# Patient Record
Sex: Female | Born: 2000 | Race: White | Hispanic: Yes | Marital: Married | Smoking: Never smoker
Health system: Southern US, Community
[De-identification: ages and names within clinical notes are randomized; demographics above are authoritative.]

## PROBLEM LIST (undated history)

## (undated) DIAGNOSIS — E162 Hypoglycemia, unspecified: Secondary | ICD-10-CM

## (undated) DIAGNOSIS — O99019 Anemia complicating pregnancy, unspecified trimester: Secondary | ICD-10-CM

## (undated) HISTORY — DX: Hypoglycemia, unspecified: E16.2

## (undated) HISTORY — PX: NO PAST SURGERIES: SHX2092

## (undated) HISTORY — DX: Anemia complicating pregnancy, unspecified trimester: O99.019

---
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
## 2021-01-03 ENCOUNTER — Other Ambulatory Visit: Payer: BC Managed Care – PPO

## 2021-01-08 DIAGNOSIS — Z3403 Encounter for supervision of normal first pregnancy, third trimester: Secondary | ICD-10-CM | POA: Diagnosis not present

## 2021-01-08 DIAGNOSIS — O2340 Unspecified infection of urinary tract in pregnancy, unspecified trimester: Secondary | ICD-10-CM | POA: Diagnosis not present

## 2021-01-08 DIAGNOSIS — Z3689 Encounter for other specified antenatal screening: Secondary | ICD-10-CM | POA: Diagnosis not present

## 2021-01-08 DIAGNOSIS — O2610 Low weight gain in pregnancy, unspecified trimester: Secondary | ICD-10-CM | POA: Diagnosis not present

## 2021-01-10 ENCOUNTER — Encounter: Payer: Self-pay | Admitting: Family Medicine

## 2021-01-10 ENCOUNTER — Other Ambulatory Visit: Payer: Self-pay

## 2021-01-10 ENCOUNTER — Ambulatory Visit (INDEPENDENT_AMBULATORY_CARE_PROVIDER_SITE_OTHER): Payer: BC Managed Care – PPO | Admitting: Family Medicine

## 2021-01-10 VITALS — BP 106/70 | HR 85 | Temp 97.6°F | Ht 61.5 in | Wt 99.2 lb

## 2021-01-10 DIAGNOSIS — Z0000 Encounter for general adult medical examination without abnormal findings: Secondary | ICD-10-CM | POA: Diagnosis not present

## 2021-01-10 DIAGNOSIS — F418 Other specified anxiety disorders: Secondary | ICD-10-CM

## 2021-01-10 NOTE — Assessment & Plan Note (Signed)
Healthy 20 yo  Currently pregnant Preventative protocols reviewed and updated unless pt declined. Discussed healthy diet and lifestyle.

## 2021-01-10 NOTE — Progress Notes (Signed)
Patient ID: [name], female    DOB: Dec 20, 2000, 20 y.o.   MRN: [id number]  This visit was conducted in person.  BP 106/70   Pulse 85   Temp 97.6 F (36.4 C) (Temporal)   Ht 5' 1.5" (1.562 m)   Wt 99 lb 4 oz (45 kg)   LMP 07/02/2020   SpO2 100%   BMI 18.45 kg/m    CC: 20 yo well adolescent visit Subjective:   HPI: Tanya Medina is a 20 y.o. female presenting on 01/10/2021 for Well Child (Here for 19 yr WCC. )   Presents alone today.  Upcoming birthday  Home -  Lives with mom and mom's BF.  Has chores.   School -  [employer] graduated 2021.  [employer] this past year - studying business administration, Bs/Cs. Not working  Activity/Exercise -  Walking regularly   Diet -  Good water, fruits/vegetables, prenatal vitamin   Limits sodas, juice.   Immunizations -  Reviewed [facility] COVID vaccine x2, booster 07/2020 Insurance underwriter)  Currently 7 months pregnant. Sees Dr Wonda Olds. EDC 04/02/2021 LMP - 07/02/2020   Seat belt use discussed Sunscreen use discussed. No changing moles on skin.  Dentist - q6 mo  Eye exam - has not recently seen. Uses glasses regularly  With parent out of room: Alcohol - none Smoking/vaping/smokeless tobacco - none Recreational drugs - none Mood - struggled with depressed mood until she saw online psychologist - sees once a month ([facility] online program) Sex - with monogamous boyfriend      Relevant past medical, surgical, family and social history reviewed and updated as indicated. Interim medical history since our last visit reviewed. Allergies and medications reviewed and updated. Outpatient Medications Prior to Visit  Medication Sig Dispense Refill   Prenatal Vit-Fe Fumarate-FA (PRENATAL COMPLETE) 14-0.4 MG TABS Take 1 tablet daily. 60 tablet 0   No facility-administered medications prior to visit.     Per HPI unless specifically indicated in ROS section below Review of Systems  Constitutional:  Negative for activity  change, appetite change, chills, fatigue, fever and unexpected weight change.  HENT:  Negative for hearing loss.   Eyes:  Negative for visual disturbance.  Respiratory:  Negative for cough, chest tightness, shortness of breath and wheezing.   Cardiovascular:  Positive for leg swelling (pregnancy related). Negative for chest pain and palpitations.  Gastrointestinal:  Positive for nausea. Negative for abdominal distention, abdominal pain, blood in stool, constipation, diarrhea and vomiting.  Genitourinary:  Negative for difficulty urinating and hematuria.  Musculoskeletal:  Negative for arthralgias, myalgias and neck pain.  Skin:  Negative for rash.  Neurological:  Positive for dizziness. Negative for seizures, syncope and headaches.  Hematological:  Negative for adenopathy. Does not bruise/bleed easily.  Psychiatric/Behavioral:  Positive for dysphoric mood. The patient is nervous/anxious.        Doing much better since starting monthly counseling.   Objective:  BP 106/70   Pulse 85   Temp 97.6 F (36.4 C) (Temporal)   Ht 5' 1.5" (1.562 m)   Wt 99 lb 4 oz (45 kg)   LMP 07/02/2020   SpO2 100%   BMI 18.45 kg/m   Wt Readings from Last 3 Encounters:  01/10/21 99 lb 4 oz (45 kg) (3 %, Z= -1.83)*  01/03/20 87 lb 7 oz (39.7 kg) (<1 %, Z= -3.05)*   * Growth percentiles are based on CDC (Girls, 2-20 Years) data.      Physical Exam Vitals and nursing  note reviewed.  Constitutional:      Appearance: Normal appearance. She is not ill-appearing.  HENT:     Head: Normocephalic and atraumatic.     Right Ear: Tympanic membrane, ear canal and external ear normal. There is no impacted cerumen.     Left Ear: Tympanic membrane, ear canal and external ear normal. There is no impacted cerumen.  Eyes:     General:        Right eye: No discharge.        Left eye: No discharge.     Extraocular Movements: Extraocular movements intact.     Conjunctiva/sclera: Conjunctivae normal.     Pupils: Pupils  are equal, round, and reactive to light.  Neck:     Thyroid: No thyroid mass or thyromegaly.  Cardiovascular:     Rate and Rhythm: Normal rate and regular rhythm.     Pulses: Normal pulses.     Heart sounds: Normal heart sounds. No murmur heard. Pulmonary:     Effort: Pulmonary effort is normal. No respiratory distress.     Breath sounds: Normal breath sounds. No wheezing, rhonchi or rales.  Abdominal:     General: Bowel sounds are normal. There is no distension.     Palpations: Abdomen is soft. There is no mass.     Tenderness: no abdominal tenderness There is no guarding or rebound.     Hernia: No hernia is present.     Comments: Gravid   Musculoskeletal:     Cervical back: Normal range of motion and neck supple. No rigidity.     Right lower leg: No edema.     Left lower leg: No edema.  Lymphadenopathy:     Cervical: No cervical adenopathy.  Skin:    General: Skin is warm and dry.     Findings: No rash.  Neurological:     General: No focal deficit present.     Mental Status: She is alert. Mental status is at baseline.  Psychiatric:        Mood and Affect: Mood normal.        Behavior: Behavior normal.      Depression screen [facility] 2/9 01/10/2021 01/03/2020  Decreased Interest 0 0  Down, Depressed, Hopeless 0 1  PHQ - 2 Score 0 1  Altered sleeping 0 0  Tired, decreased energy 0 0  Change in appetite 0 1  Feeling bad or failure about yourself  0 0  Trouble concentrating 0 0  Moving slowly or fidgety/restless 0 0  Suicidal thoughts 0 -  PHQ-9 Score 0 2    GAD 7 : Generalized Anxiety Score 01/10/2021  Nervous, Anxious, on Edge 1  Control/stop worrying 1  Worry too much - different things 0  Trouble relaxing 0  Restless 0  Easily annoyed or irritable 1  Afraid - awful might happen 0  Total GAD 7 Score 3   Assessment & Plan:  This visit occurred during the SARS-CoV-2 public health emergency.  Safety protocols were in place, including screening questions prior to the  visit, additional usage of staff PPE, and extensive cleaning of exam room while observing appropriate contact time as indicated for disinfecting solutions.   Problem List Items Addressed This Visit     Health maintenance examination - Primary    Healthy 20 yo  Currently pregnant Preventative protocols reviewed and updated unless pt declined. Discussed healthy diet and lifestyle.        Depression with anxiety    During pregnancy  Doing much better with monthly counseling.  Low PHQ9/GAD7.  No need for medication.  Will need to closely monitor for post-partum depression.         No orders of the defined types were placed in this encounter.  No orders of the defined types were placed in this encounter.   Patient instructions: Depression/anxiety questionairre provided today.  Gusto verla hoy  Regresar en 1 ao para proximo examen fisico.   Follow up plan: Return in about 1 year (around 01/10/2022) for annual exam, prior fasting for blood work.  Eustaquio Boyden, MD

## 2021-01-10 NOTE — Patient Instructions (Addendum)
Depression/anxiety questionairre provided today.  Gusto verla hoy  Regresar en 1 ao para proximo examen fisico.  Mantenimiento de Radiographer, therapeutic en las [facility], Female Adoptar un estilo de vida saludable y recibir atencin preventiva son importantes para promover la salud y Counsellor. Consulte al mdico sobre: El esquema adecuado para hacerse pruebas y exmenes peridicos. Cosas que puede hacer por su cuenta para prevenir enfermedades y [employer]. Qu debo saber sobre la dieta, el peso y el ejercicio? Consuma una dieta saludable  Consuma una dieta que incluya muchas verduras, frutas, productos lcteos con bajo contenido de [place] y Associate Professor. No consuma muchos alimentos ricos en grasas slidas, azcares agregados o sodio.  Mantenga un peso saludable El ndice de masa muscular [facility]) se [place] para identificar problemas de [place]. Proporciona una estimacin de la grasa corporal basndose en el peso y la altura. Su mdico puede ayudarle a Engineer, site IMC y a Personnel officer o Pharmacologist unpeso saludable. Haga ejercicio con regularidad Haga ejercicio con regularidad. Esta es una de las prcticas ms importantes que puede hacer por su salud. La [employer] de los adultos deben seguir estas pautas: Education officer, environmental, al menos, 150 minutos de actividad fsica por semana. El ejercicio debe aumentar la frecuencia cardaca y Media planner transpirar (ejercicio de intensidad moderada). Hacer ejercicios de fortalecimiento por lo [employer] por semana. Agregue esto a su plan de ejercicio de intensidad moderada. Pasar menos tiempo sentados. Incluso la actividad fsica ligera puede ser beneficiosa. Controle sus niveles de colesterol y lpidos en la sangre Comience a realizarse anlisis de lpidos y colesterol en la sangre a los20 aos y luego reptalos cada 5 aos. Hgase controlar los niveles de colesterol con mayor frecuencia si: Sus niveles de lpidos y colesterol son altos. Es mayor de 40  aos. Presenta un alto riesgo de padecer enfermedades cardacas. Qu debo saber sobre las pruebas de deteccin del cncer? Segn su historia clnica y sus antecedentes familiares, es posible que deba realizarse pruebas de deteccin del cncer en diferentes edades. Esto puede incluir pruebas de deteccin de lo siguiente: Cncer de mama. Cncer de cuello uterino. Cncer colorrectal. Cncer de piel. Cncer de pulmn. Qu debo saber sobre la enfermedad cardaca, la diabetes y la hipertensinarterial? Presin arterial y enfermedad cardaca La hipertensin arterial causa enfermedades cardacas y [place] el riesgo de accidente cerebrovascular. Es ms probable que esto se manifieste en las personas que tienen lecturas de presin arterial alta, tienen ascendencia africana o tienen sobrepeso. Hgase controlar la presin arterial: Cada 3 a 5 aos si tiene entre 18 y 28 aos. Todos los aos si es mayor de 40 aos. Diabetes Realcese exmenes de deteccin de la diabetes con regularidad. Este anlisis revisa el nivel de azcar en la sangre en [place]. Hgase las pruebas de deteccin: Cada tres aos despus de los 40 aos de edad si tiene un peso normal y un bajo riesgo de padecer diabetes. Con ms frecuencia y a partir de [place] edad inferior si tiene sobrepeso o un alto riesgo de padecer diabetes. Qu debo saber sobre la prevencin de infecciones? Hepatitis B Si tiene un riesgo ms alto de contraer hepatitis B, debe someterse a un examen de deteccin de este virus. Hable con el mdico para averiguar si tiene riesgode contraer la infeccin por hepatitis B. Hepatitis C Se recomienda el anlisis a: [employer] 1944 y 1965. Todas las personas que tengan un riesgo de haber contrado hepatitis C. Enfermedades de transmisin sexual (ETS) Hgase las pruebas de  deteccin de ITS, incluidas la gonorrea y la clamidia, si: Es sexualmente activa y es menor de [address]. Es mayor de [address], y Scientist, research (physical sciences) informa que corre riesgo de tener este tipo de infecciones. La actividad sexual ha cambiado desde que le hicieron la ltima prueba de deteccin y tiene un riesgo mayor de Warehouse manager clamidia o Copy. Pregntele al mdico si usted tiene riesgo. Pregntele al mdico si usted tiene un alto riesgo de Primary school teacher VIH. El mdico tambin puede recomendarle un medicamento recetado para ayudar a evitar la infeccin por el VIH. Si elige tomar medicamentos para prevenir el VIH, primero debe [employer] de deteccin del VIH. Luego debe hacerse anlisis cada 3 meses mientras est tomando los medicamentos. Embarazo Si est por dejar de Armed forces training and education officer (fase premenopusica) y usted puede quedar [place], busque asesoramiento antes de [place]. [place] de 400 a 800 microgramos (mcg) de cido [employer] si [place]. Pida mtodos de control de la natalidad (anticonceptivos) si desea evitar un embarazo no deseado. Osteoporosis y [place] La osteoporosis es una enfermedad en la que los huesos pierden los minerales y la fuerza por el avance de la edad. El resultado pueden ser fracturas en los [place]. Si tiene 65 aos o ms, o si est en riesgo de sufrir osteoporosis y fracturas, pregunte a su mdico si debe: Hacerse pruebas de deteccin de prdida sea. Tomar un suplemento de calcio o de vitamina D para reducir el riesgo de fracturas. Recibir terapia de reemplazo hormonal (TRH) para tratar los sntomas de la menopausia. Siga estas instrucciones en su casa: Estilo de vida No consuma ningn producto que contenga nicotina o tabaco, como cigarrillos, cigarrillos electrnicos y tabaco de Theatre manager. Si necesita ayuda para dejar de fumar, consulte al mdico. No consuma drogas. No comparta agujas. Solicite ayuda a su mdico si necesita apoyo o informacin para abandonar las drogas. Consumo de alcohol No beba alcohol si: Su mdico le indica no hacerlo. Est embarazada, puede estar embarazada  o est tratando de [place]. Si bebe alcohol: Limite la cantidad que consume de 0 a 1 medida por da. Limite la ingesta si est amamantando. Est atento a la cantidad de alcohol que hay en las bebidas que toma. En los [address], una medida equivale a una botella de cerveza de 12 oz (355 ml), un vaso de vino de 5 oz (148 ml) o un vaso de una bebida alcohlica de alta graduacin de 1 oz (44 ml). Instrucciones generales Realcese los estudios de rutina de la salud, dentales y de Wellsite geologist. Mantngase al da con las vacunas. Infrmele a su mdico si: Se siente deprimida con frecuencia. Alguna vez ha sido vctima de [place] o no se siente segura en su casa. Resumen Adoptar un estilo de vida saludable y recibir atencin preventiva son importantes para promover la salud y Counsellor. Siga las instrucciones del mdico acerca de una dieta saludable, el ejercicio y la realizacin de pruebas o exmenes para Hotel manager. Siga las instrucciones del mdico con respecto al control del colesterol y la presin arterial. Esta informacin no tiene Theme park manager el consejo del mdico. Asegresede hacerle al mdico cualquier pregunta que tenga. Document Revised: 08/10/2018 Document Reviewed: 08/10/2018 Elsevier Patient Education  2022 [employer].

## 2021-01-10 NOTE — Assessment & Plan Note (Signed)
During pregnancy Doing much better with monthly counseling.  Low PHQ9/GAD7.  No need for medication.  Will need to closely monitor for post-partum depression.

## 2021-01-23 DIAGNOSIS — O2340 Unspecified infection of urinary tract in pregnancy, unspecified trimester: Secondary | ICD-10-CM | POA: Diagnosis not present

## 2021-01-23 DIAGNOSIS — Z3403 Encounter for supervision of normal first pregnancy, third trimester: Secondary | ICD-10-CM | POA: Diagnosis not present

## 2021-01-23 DIAGNOSIS — O365930 Maternal care for other known or suspected poor fetal growth, third trimester, not applicable or unspecified: Secondary | ICD-10-CM | POA: Diagnosis not present

## 2021-01-30 ENCOUNTER — Other Ambulatory Visit: Payer: Self-pay | Admitting: Obstetrics and Gynecology

## 2021-01-30 DIAGNOSIS — O365990 Maternal care for other known or suspected poor fetal growth, unspecified trimester, not applicable or unspecified: Secondary | ICD-10-CM

## 2021-02-07 ENCOUNTER — Ambulatory Visit (INDEPENDENT_AMBULATORY_CARE_PROVIDER_SITE_OTHER): Payer: Medicaid Other | Admitting: Pediatrics

## 2021-02-07 ENCOUNTER — Other Ambulatory Visit: Payer: Self-pay

## 2021-02-07 DIAGNOSIS — Z7681 Expectant parent(s) prebirth pediatrician visit: Secondary | ICD-10-CM

## 2021-02-10 ENCOUNTER — Other Ambulatory Visit: Payer: Self-pay

## 2021-02-10 ENCOUNTER — Other Ambulatory Visit: Payer: Self-pay | Admitting: *Deleted

## 2021-02-10 ENCOUNTER — Encounter: Payer: Self-pay | Admitting: *Deleted

## 2021-02-10 ENCOUNTER — Ambulatory Visit: Payer: BC Managed Care – PPO | Admitting: *Deleted

## 2021-02-10 ENCOUNTER — Ambulatory Visit: Payer: BC Managed Care – PPO | Attending: Obstetrics and Gynecology

## 2021-02-10 VITALS — BP 99/63 | HR 87

## 2021-02-10 DIAGNOSIS — Z3403 Encounter for supervision of normal first pregnancy, third trimester: Secondary | ICD-10-CM

## 2021-02-10 DIAGNOSIS — Z362 Encounter for other antenatal screening follow-up: Secondary | ICD-10-CM | POA: Diagnosis not present

## 2021-02-10 DIAGNOSIS — O365930 Maternal care for other known or suspected poor fetal growth, third trimester, not applicable or unspecified: Secondary | ICD-10-CM | POA: Diagnosis not present

## 2021-02-10 DIAGNOSIS — O365990 Maternal care for other known or suspected poor fetal growth, unspecified trimester, not applicable or unspecified: Secondary | ICD-10-CM

## 2021-02-10 DIAGNOSIS — Z3A32 32 weeks gestation of pregnancy: Secondary | ICD-10-CM

## 2021-02-12 DIAGNOSIS — Z23 Encounter for immunization: Secondary | ICD-10-CM | POA: Diagnosis not present

## 2021-02-12 DIAGNOSIS — O365930 Maternal care for other known or suspected poor fetal growth, third trimester, not applicable or unspecified: Secondary | ICD-10-CM | POA: Diagnosis not present

## 2021-02-12 DIAGNOSIS — O0993 Supervision of high risk pregnancy, unspecified, third trimester: Secondary | ICD-10-CM | POA: Diagnosis not present

## 2021-02-12 DIAGNOSIS — O2340 Unspecified infection of urinary tract in pregnancy, unspecified trimester: Secondary | ICD-10-CM | POA: Diagnosis not present

## 2021-02-17 ENCOUNTER — Ambulatory Visit: Payer: BC Managed Care – PPO | Attending: Obstetrics

## 2021-02-17 ENCOUNTER — Ambulatory Visit: Payer: BC Managed Care – PPO | Admitting: *Deleted

## 2021-02-17 ENCOUNTER — Other Ambulatory Visit: Payer: Self-pay

## 2021-02-17 VITALS — BP 127/65 | HR 97

## 2021-02-17 DIAGNOSIS — Z362 Encounter for other antenatal screening follow-up: Secondary | ICD-10-CM

## 2021-02-17 DIAGNOSIS — O365930 Maternal care for other known or suspected poor fetal growth, third trimester, not applicable or unspecified: Secondary | ICD-10-CM

## 2021-02-17 DIAGNOSIS — Z3A33 33 weeks gestation of pregnancy: Secondary | ICD-10-CM

## 2021-02-18 NOTE — Progress Notes (Signed)
Prenatal counseling for impending newborn done--  Reviewed current vaccine policy and answered all questions.  1st child, Currently 32 weeks, Current complications:  measuring small, Prenatal care initiated:  early Z76.81

## 2021-02-26 ENCOUNTER — Encounter: Payer: Self-pay | Admitting: *Deleted

## 2021-02-26 ENCOUNTER — Ambulatory Visit: Payer: BC Managed Care – PPO | Admitting: *Deleted

## 2021-02-26 ENCOUNTER — Other Ambulatory Visit: Payer: Self-pay

## 2021-02-26 ENCOUNTER — Ambulatory Visit: Payer: BC Managed Care – PPO | Attending: Obstetrics

## 2021-02-26 VITALS — BP 108/75 | HR 85

## 2021-02-26 DIAGNOSIS — Z3A34 34 weeks gestation of pregnancy: Secondary | ICD-10-CM | POA: Diagnosis not present

## 2021-02-26 DIAGNOSIS — O365930 Maternal care for other known or suspected poor fetal growth, third trimester, not applicable or unspecified: Secondary | ICD-10-CM | POA: Diagnosis not present

## 2021-02-26 DIAGNOSIS — Z362 Encounter for other antenatal screening follow-up: Secondary | ICD-10-CM

## 2021-02-27 DIAGNOSIS — O365930 Maternal care for other known or suspected poor fetal growth, third trimester, not applicable or unspecified: Secondary | ICD-10-CM | POA: Diagnosis not present

## 2021-02-27 DIAGNOSIS — O0993 Supervision of high risk pregnancy, unspecified, third trimester: Secondary | ICD-10-CM | POA: Diagnosis not present

## 2021-02-27 DIAGNOSIS — O2340 Unspecified infection of urinary tract in pregnancy, unspecified trimester: Secondary | ICD-10-CM | POA: Diagnosis not present

## 2021-02-27 DIAGNOSIS — O2610 Low weight gain in pregnancy, unspecified trimester: Secondary | ICD-10-CM | POA: Diagnosis not present

## 2021-03-05 ENCOUNTER — Other Ambulatory Visit: Payer: Self-pay

## 2021-03-05 ENCOUNTER — Ambulatory Visit: Payer: BC Managed Care – PPO | Attending: Obstetrics

## 2021-03-05 ENCOUNTER — Ambulatory Visit: Payer: BC Managed Care – PPO | Admitting: *Deleted

## 2021-03-05 ENCOUNTER — Encounter: Payer: Self-pay | Admitting: *Deleted

## 2021-03-05 VITALS — BP 108/62 | HR 80

## 2021-03-05 DIAGNOSIS — O365930 Maternal care for other known or suspected poor fetal growth, third trimester, not applicable or unspecified: Secondary | ICD-10-CM | POA: Diagnosis not present

## 2021-03-05 DIAGNOSIS — Z3A35 35 weeks gestation of pregnancy: Secondary | ICD-10-CM | POA: Diagnosis not present

## 2021-03-05 DIAGNOSIS — Z362 Encounter for other antenatal screening follow-up: Secondary | ICD-10-CM

## 2021-03-06 DIAGNOSIS — O0993 Supervision of high risk pregnancy, unspecified, third trimester: Secondary | ICD-10-CM | POA: Diagnosis not present

## 2021-03-06 DIAGNOSIS — O2340 Unspecified infection of urinary tract in pregnancy, unspecified trimester: Secondary | ICD-10-CM | POA: Diagnosis not present

## 2021-03-06 DIAGNOSIS — B373 Candidiasis of vulva and vagina: Secondary | ICD-10-CM | POA: Diagnosis not present

## 2021-03-10 ENCOUNTER — Ambulatory Visit: Payer: BC Managed Care – PPO

## 2021-03-12 DIAGNOSIS — O2340 Unspecified infection of urinary tract in pregnancy, unspecified trimester: Secondary | ICD-10-CM | POA: Diagnosis not present

## 2021-03-12 DIAGNOSIS — O0993 Supervision of high risk pregnancy, unspecified, third trimester: Secondary | ICD-10-CM | POA: Diagnosis not present

## 2021-03-12 DIAGNOSIS — O2610 Low weight gain in pregnancy, unspecified trimester: Secondary | ICD-10-CM | POA: Diagnosis not present

## 2021-03-20 DIAGNOSIS — O2610 Low weight gain in pregnancy, unspecified trimester: Secondary | ICD-10-CM | POA: Diagnosis not present

## 2021-03-20 DIAGNOSIS — O0993 Supervision of high risk pregnancy, unspecified, third trimester: Secondary | ICD-10-CM | POA: Diagnosis not present

## 2021-03-20 DIAGNOSIS — O2340 Unspecified infection of urinary tract in pregnancy, unspecified trimester: Secondary | ICD-10-CM | POA: Diagnosis not present

## 2021-03-26 DIAGNOSIS — O2340 Unspecified infection of urinary tract in pregnancy, unspecified trimester: Secondary | ICD-10-CM | POA: Diagnosis not present

## 2021-03-26 DIAGNOSIS — O0993 Supervision of high risk pregnancy, unspecified, third trimester: Secondary | ICD-10-CM | POA: Diagnosis not present

## 2021-03-26 DIAGNOSIS — O2610 Low weight gain in pregnancy, unspecified trimester: Secondary | ICD-10-CM | POA: Diagnosis not present

## 2021-04-03 DIAGNOSIS — O2340 Unspecified infection of urinary tract in pregnancy, unspecified trimester: Secondary | ICD-10-CM | POA: Diagnosis not present

## 2021-04-03 DIAGNOSIS — Z3403 Encounter for supervision of normal first pregnancy, third trimester: Secondary | ICD-10-CM | POA: Diagnosis not present

## 2021-04-08 DIAGNOSIS — O2340 Unspecified infection of urinary tract in pregnancy, unspecified trimester: Secondary | ICD-10-CM | POA: Diagnosis not present

## 2021-04-08 DIAGNOSIS — Z3403 Encounter for supervision of normal first pregnancy, third trimester: Secondary | ICD-10-CM | POA: Diagnosis not present

## 2021-04-09 ENCOUNTER — Encounter (HOSPITAL_COMMUNITY): Payer: Self-pay

## 2021-04-09 ENCOUNTER — Telehealth (HOSPITAL_COMMUNITY): Payer: Self-pay | Admitting: *Deleted

## 2021-04-09 ENCOUNTER — Other Ambulatory Visit: Payer: Self-pay | Admitting: Obstetrics and Gynecology

## 2021-04-09 ENCOUNTER — Encounter (HOSPITAL_COMMUNITY): Payer: Self-pay | Admitting: *Deleted

## 2021-04-09 LAB — SARS CORONAVIRUS 2 (TAT 6-24 HRS): SARS Coronavirus 2: NEGATIVE

## 2021-04-09 NOTE — Telephone Encounter (Signed)
Preadmission screen  

## 2021-04-10 ENCOUNTER — Encounter (HOSPITAL_COMMUNITY): Payer: Self-pay | Admitting: Obstetrics and Gynecology

## 2021-04-10 ENCOUNTER — Inpatient Hospital Stay (HOSPITAL_COMMUNITY)
Admission: AD | Admit: 2021-04-10 | Discharge: 2021-04-13 | DRG: 807 | Disposition: A | Discharge status: Home / Self Care | Payer: BC Managed Care – PPO | Attending: Obstetrics and Gynecology | Admitting: Obstetrics and Gynecology

## 2021-04-10 DIAGNOSIS — O43893 Other placental disorders, third trimester: Secondary | ICD-10-CM | POA: Diagnosis not present

## 2021-04-10 DIAGNOSIS — O99824 Streptococcus B carrier state complicating childbirth: Secondary | ICD-10-CM | POA: Diagnosis not present

## 2021-04-10 DIAGNOSIS — Z3A40 40 weeks gestation of pregnancy: Secondary | ICD-10-CM

## 2021-04-10 DIAGNOSIS — O411230 Chorioamnionitis, third trimester, not applicable or unspecified: Secondary | ICD-10-CM | POA: Diagnosis not present

## 2021-04-10 DIAGNOSIS — O26893 Other specified pregnancy related conditions, third trimester: Secondary | ICD-10-CM | POA: Diagnosis not present

## 2021-04-10 DIAGNOSIS — Z3403 Encounter for supervision of normal first pregnancy, third trimester: Secondary | ICD-10-CM | POA: Diagnosis not present

## 2021-04-10 DIAGNOSIS — Z3A00 Weeks of gestation of pregnancy not specified: Secondary | ICD-10-CM | POA: Diagnosis not present

## 2021-04-10 DIAGNOSIS — R8271 Bacteriuria: Secondary | ICD-10-CM | POA: Diagnosis not present

## 2021-04-10 DIAGNOSIS — O2610 Low weight gain in pregnancy, unspecified trimester: Secondary | ICD-10-CM | POA: Diagnosis not present

## 2021-04-10 LAB — CBC
HCT: 41.8 % (ref 36.0–46.0)
Hemoglobin: 14.2 g/dL (ref 12.0–15.0)
MCH: 35.1 pg — ABNORMAL HIGH (ref 26.0–34.0)
MCHC: 34 g/dL (ref 30.0–36.0)
MCV: 103.5 fL — ABNORMAL HIGH (ref 80.0–100.0)
Platelets: 252 10*3/uL (ref 150–400)
RBC: 4.04 MIL/uL (ref 3.87–5.11)
RDW: 13.4 % (ref 11.5–15.5)
WBC: 15.3 10*3/uL — ABNORMAL HIGH (ref 4.0–10.5)
nRBC: 0 % (ref 0.0–0.2)

## 2021-04-10 LAB — POCT FERN TEST: POCT Fern Test: POSITIVE

## 2021-04-10 MED ORDER — PENICILLIN G POT IN DEXTROSE 60000 UNIT/ML IV SOLN: 3.0000 10*6.[IU] | INTRAVENOUS | Status: DC

## 2021-04-10 MED ORDER — OXYCODONE-ACETAMINOPHEN 5-325 MG PO TABS: 2.0000 | ORAL_TABLET | ORAL | Status: DC | PRN

## 2021-04-10 MED ORDER — OXYCODONE-ACETAMINOPHEN 5-325 MG PO TABS: 1.0000 | ORAL_TABLET | ORAL | Status: DC | PRN

## 2021-04-10 MED ORDER — ACETAMINOPHEN 325 MG PO TABS: 650.0000 mg | ORAL_TABLET | ORAL | Status: DC | PRN

## 2021-04-10 MED ORDER — LIDOCAINE HCL (PF) 1 % IJ SOLN
30.0000 mL | INTRAMUSCULAR | Status: AC | PRN
  Administered 2021-04-11: 30 mL via SUBCUTANEOUS
  Filled 2021-04-10: qty 30

## 2021-04-10 MED ORDER — PHENYLEPHRINE 40 MCG/ML (10ML) SYRINGE FOR IV PUSH (FOR BLOOD PRESSURE SUPPORT): 80.0000 ug | PREFILLED_SYRINGE | INTRAVENOUS | Status: DC | PRN

## 2021-04-10 MED ORDER — SODIUM CHLORIDE 0.9 % IV SOLN
5.0000 10*6.[IU] | Freq: Once | INTRAVENOUS | Status: DC
  Filled 2021-04-10: qty 5

## 2021-04-10 MED ORDER — EPHEDRINE 5 MG/ML INJ: 10.0000 mg | INTRAVENOUS | Status: DC | PRN

## 2021-04-10 MED ORDER — ONDANSETRON HCL 4 MG/2ML IJ SOLN: 4.0000 mg | Freq: Four times a day (QID) | INTRAMUSCULAR | Status: DC | PRN

## 2021-04-10 MED ORDER — FENTANYL-BUPIVACAINE-NACL 0.5-0.125-0.9 MG/250ML-% EP SOLN
12.0000 mL/h | EPIDURAL | Status: DC | PRN
  Administered 2021-04-11: 12 mL/h via EPIDURAL
  Filled 2021-04-10: qty 250

## 2021-04-10 MED ORDER — OXYTOCIN 10 UNIT/ML IJ SOLN: 10.0000 [IU] | Freq: Once | INTRAMUSCULAR | Status: DC

## 2021-04-10 MED ORDER — OXYTOCIN-SODIUM CHLORIDE 30-0.9 UT/500ML-% IV SOLN
2.5000 [IU]/h | INTRAVENOUS | Status: DC
  Filled 2021-04-10: qty 500

## 2021-04-10 MED ORDER — LACTATED RINGERS IV SOLN: INTRAVENOUS | Status: DC

## 2021-04-10 MED ORDER — DIPHENHYDRAMINE HCL 50 MG/ML IJ SOLN: 12.5000 mg | INTRAMUSCULAR | Status: DC | PRN

## 2021-04-10 MED ORDER — LACTATED RINGERS IV SOLN
500.0000 mL | Freq: Once | INTRAVENOUS | Status: AC
  Administered 2021-04-11: 500 mL via INTRAVENOUS

## 2021-04-10 MED ORDER — SOD CITRATE-CITRIC ACID 500-334 MG/5ML PO SOLN: 30.0000 mL | ORAL | Status: DC | PRN

## 2021-04-10 MED ORDER — OXYTOCIN BOLUS FROM INFUSION
333.0000 mL | Freq: Once | INTRAVENOUS | Status: AC
  Administered 2021-04-11: 333 mL via INTRAVENOUS

## 2021-04-10 MED ORDER — LACTATED RINGERS IV SOLN: 500.0000 mL | INTRAVENOUS | Status: DC | PRN

## 2021-04-10 NOTE — H&P (Signed)
Tanya Medina is a 20 y.o. female presenting @ 40 3/7 weeks with early labor and SROM. (+) FM GBS cx positive. 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 Social History:  reports that she has never smoked. She has never used smokeless tobacco. She reports that she does not drink alcohol and does not use drugs.     Maternal Diabetes: No Genetic Screening: Normal Maternal Ultrasounds/Referrals: Normal Fetal Ultrasounds or other Referrals:  None Maternal Substance Abuse:  No Significant Maternal Medications:  Meds include: Other:  Significant Maternal Lab Results:  Group B Strep positive Other Comments:  None  Review of Systems  All other systems reviewed and are negative. History Dilation: 1.5 Effacement (%): 100 Station: -2 Exam by:: [employer] Blood pressure 113/71, temperature 98.1 F (36.7 C), temperature source Oral, resp. rate 15, last menstrual period 07/02/2020, SpO2 100 %. Exam Physical Exam Constitutional:      Appearance: Normal appearance.  Eyes:     Extraocular Movements: Extraocular movements intact.  Cardiovascular:     Rate and Rhythm: Regular rhythm.  Pulmonary:     Breath sounds: Normal breath sounds.  Abdominal:     Palpations: Abdomen is soft.  Musculoskeletal:        General: No  swelling. Normal range of motion.     Cervical back: Neck supple.  Skin:    General: Skin is warm and dry.  Neurological:     General: No focal deficit present.     Mental Status: She is alert and oriented to person, place, and time.  Psychiatric:        Mood and Affect: Mood normal.        Behavior: Behavior normal.    Prenatal labs: ABO, Rh: O/Positive/-- (02/16 0000) Antibody: Negative (02/16 0000) Rubella: Immune (02/16 0000) RPR: Nonreactive (02/16 0000)  HBsAg: Negative (02/16 0000)  HIV: Non-reactive (02/16 0000)  GBS: Positive/-- (02/16 0000)  Hep C neg Assessment/Plan: Srom Early labor (+) GBS  P) admit routine labs. IV PCN. Epidural prn Seddrick Flax A Drayton Tieu 04/10/2021, 11:14 PM

## 2021-04-10 NOTE — MAU Note (Signed)
.  Tanya Medina is a 20 y.o. at [redacted]w[redacted]d here in MAU reporting: ctx that are 5 minutes apart. SROM at 2245. Having bloody show. Endorses good fetal movement. GBS pos.  Pain score: 5 Vitals:   04/10/21 2255  BP: 113/71  Resp: 15  Temp: 98.1 F (36.7 C)

## 2021-04-11 ENCOUNTER — Inpatient Hospital Stay (HOSPITAL_COMMUNITY): Payer: BC Managed Care – PPO

## 2021-04-11 ENCOUNTER — Encounter (HOSPITAL_COMMUNITY): Payer: Self-pay | Admitting: Obstetrics and Gynecology

## 2021-04-11 ENCOUNTER — Inpatient Hospital Stay (HOSPITAL_COMMUNITY)
Admission: AD | Admit: 2021-04-11 | Payer: BC Managed Care – PPO | Source: Home / Self Care | Admitting: Obstetrics and Gynecology

## 2021-04-11 ENCOUNTER — Other Ambulatory Visit: Payer: Self-pay

## 2021-04-11 ENCOUNTER — Inpatient Hospital Stay (HOSPITAL_COMMUNITY): Payer: BC Managed Care – PPO | Admitting: Anesthesiology

## 2021-04-11 LAB — TYPE AND SCREEN
ABO/RH(D): O POS
Antibody Screen: NEGATIVE

## 2021-04-11 LAB — RPR: RPR Ser Ql: NONREACTIVE

## 2021-04-11 MED ORDER — OXYCODONE HCL 5 MG PO TABS
5.0000 mg | ORAL_TABLET | ORAL | Status: DC | PRN
Start: 2021-04-11 — End: 2021-04-13

## 2021-04-11 MED ORDER — FERROUS SULFATE 325 (65 FE) MG PO TABS
325.0000 mg | ORAL_TABLET | Freq: Two times a day (BID) | ORAL | Status: DC
  Administered 2021-04-11 – 2021-04-13 (×4): 325 mg via ORAL
  Filled 2021-04-11 (×4): qty 1

## 2021-04-11 MED ORDER — COCONUT OIL OIL
1.0000 "application " | TOPICAL_OIL | Status: DC | PRN
  Administered 2021-04-11: 1 via TOPICAL

## 2021-04-11 MED ORDER — ONDANSETRON HCL 4 MG/2ML IJ SOLN: 4.0000 mg | INTRAMUSCULAR | Status: DC | PRN

## 2021-04-11 MED ORDER — SENNOSIDES-DOCUSATE SODIUM 8.6-50 MG PO TABS
2.0000 | ORAL_TABLET | Freq: Every day | ORAL | Status: DC
  Filled 2021-04-11 (×2): qty 2

## 2021-04-11 MED ORDER — IBUPROFEN 600 MG PO TABS
600.0000 mg | ORAL_TABLET | Freq: Four times a day (QID) | ORAL | Status: DC
  Administered 2021-04-11 – 2021-04-13 (×7): 600 mg via ORAL
  Filled 2021-04-11 (×8): qty 1

## 2021-04-11 MED ORDER — WITCH HAZEL-GLYCERIN EX PADS: 1.0000 "application " | MEDICATED_PAD | CUTANEOUS | Status: DC | PRN

## 2021-04-11 MED ORDER — OXYCODONE HCL 5 MG PO TABS: 10.0000 mg | ORAL_TABLET | ORAL | Status: DC | PRN

## 2021-04-11 MED ORDER — LIDOCAINE HCL (PF) 1 % IJ SOLN
INTRAMUSCULAR | Status: DC | PRN
  Administered 2021-04-11 (×2): 4 mL via EPIDURAL

## 2021-04-11 MED ORDER — ZOLPIDEM TARTRATE 5 MG PO TABS: 5.0000 mg | ORAL_TABLET | Freq: Every evening | ORAL | Status: DC | PRN

## 2021-04-11 MED ORDER — ONDANSETRON HCL 4 MG PO TABS: 4.0000 mg | ORAL_TABLET | ORAL | Status: DC | PRN

## 2021-04-11 MED ORDER — AMPICILLIN SODIUM 1 G IJ SOLR: 1.0000 g | INTRAMUSCULAR | Status: DC

## 2021-04-11 MED ORDER — SIMETHICONE 80 MG PO CHEW: 80.0000 mg | CHEWABLE_TABLET | ORAL | Status: DC | PRN

## 2021-04-11 MED ORDER — DIPHENHYDRAMINE HCL 25 MG PO CAPS: 25.0000 mg | ORAL_CAPSULE | Freq: Four times a day (QID) | ORAL | Status: DC | PRN

## 2021-04-11 MED ORDER — SODIUM CHLORIDE 0.9 % IV SOLN
2.0000 g | Freq: Once | INTRAVENOUS | Status: AC
  Administered 2021-04-11: 2 g via INTRAVENOUS
  Filled 2021-04-11: qty 2000

## 2021-04-11 MED ORDER — DIBUCAINE (PERIANAL) 1 % EX OINT: 1.0000 "application " | TOPICAL_OINTMENT | CUTANEOUS | Status: DC | PRN

## 2021-04-11 MED ORDER — ACETAMINOPHEN 325 MG PO TABS: 650.0000 mg | ORAL_TABLET | ORAL | Status: DC | PRN

## 2021-04-11 MED ORDER — BENZOCAINE-MENTHOL 20-0.5 % EX AERO
1.0000 "application " | INHALATION_SPRAY | CUTANEOUS | Status: DC | PRN
  Administered 2021-04-11: 1 via TOPICAL
  Filled 2021-04-11 (×2): qty 56

## 2021-04-11 MED ORDER — LACTATED RINGERS IV SOLN: 500.0000 mL | Freq: Once | INTRAVENOUS | Status: DC

## 2021-04-11 MED ORDER — PRENATAL MULTIVITAMIN CH
1.0000 | ORAL_TABLET | Freq: Every day | ORAL | Status: DC
  Administered 2021-04-11: 1 via ORAL
  Filled 2021-04-11: qty 1

## 2021-04-11 NOTE — Anesthesia Preprocedure Evaluation (Signed)
Anesthesia Evaluation  Patient identified by MRN, date of birth, ID band Patient awake    Reviewed: Allergy & Precautions, Patient's Chart, lab work & pertinent test results  History of Anesthesia Complications Negative for: history of anesthetic complications  Airway Mallampati: II  TM Distance: >3 FB Neck ROM: Full    Dental no notable dental hx.    Pulmonary neg pulmonary ROS,    Pulmonary exam normal        Cardiovascular negative cardio ROS Normal cardiovascular exam     Neuro/Psych Anxiety Depression negative neurological ROS     GI/Hepatic negative GI ROS, Neg liver ROS,   Endo/Other  negative endocrine ROS  Renal/GU negative Renal ROS  negative genitourinary   Musculoskeletal negative musculoskeletal ROS (+)   Abdominal   Peds  Hematology negative hematology ROS (+)   Anesthesia Other Findings Day of surgery medications reviewed with patient.  Reproductive/Obstetrics (+) Pregnancy                             Anesthesia Physical Anesthesia Plan  ASA: 2  Anesthesia Plan: Epidural   Post-op Pain Management:    Induction:   PONV Risk Score and Plan: Treatment may vary due to age or medical condition  Airway Management Planned: Natural Airway  Additional Equipment:   Intra-op Plan:   Post-operative Plan:   Informed Consent: I have reviewed the patients History and Physical, chart, labs and discussed the procedure including the risks, benefits and alternatives for the proposed anesthesia with the patient or authorized representative who has indicated his/her understanding and acceptance.       Plan Discussed with:   Anesthesia Plan Comments:         Anesthesia Quick Evaluation  

## 2021-04-11 NOTE — Lactation Note (Signed)
This note was copied from a baby's chart. Lactation Consultation Note  Patient Name: Tanya Medina [id number] Date: 04/11/2021 Reason for consult: Initial assessment;1st time breastfeeding;Primapara;Term Age:19 hours   P1 mother whose infant is now 37 hours old.  This is a term baby at 40+3 weeks.  Mother's feeding preference is breast/formula.  RN in room when I arrived.  Reviewed breast feeding basics with mother.  Encouraged lots of STS and observing for feeding cues.  Mother has recently breast fed and denied pain with feeding.  Suggested she call her RN/LC for latch assistance as needed, especially since this is her first experience with breast feeding.  Mother verbalized understanding.    Mom made aware of O/P services, breastfeeding support groups, community resources, and our phone # for post-discharge questions.  Mother has a DEBP for home use.  Discussed pumping briefly as mother would like to pump also after discharge.  Father and family member present.     Maternal Data Has patient been taught Hand Expression?: Yes Does the patient have breastfeeding experience prior to this delivery?: No  Feeding Mother's Current Feeding Choice: Breast Milk and Formula  LATCH Score Latch: Repeated attempts needed to sustain latch, nipple held in mouth throughout feeding, stimulation needed to elicit sucking reflex.  Audible Swallowing: None  Type of Nipple: Flat  Comfort (Breast/Nipple): Soft / non-tender  Hold (Positioning): Assistance needed to correctly position infant at breast and maintain latch.  LATCH Score: 5   Lactation Tools Discussed/Used    Interventions Interventions: Breast feeding basics reviewed  Discharge Pump: Personal  Consult Status Consult Status: Follow-up Date: 04/12/21 Follow-up type: In-patient    Ayauna Mcnay R Zilla Shartzer 04/11/2021, 8:32 AM

## 2021-04-11 NOTE — Social Work (Addendum)
CSW received consult for hx of Anxiety and Depression.  CSW met with MOB to offer support and complete assessment.    CSW met with MOB at bedside and introduced CSW role. CSW congratulated MOB and FOB. CSW observed MOB holding the infant and FOB present at bedside. MOB presented calm and welcoming of [place] visit. FOB left room to give MOB privacy. CSW inquired how MOB has felt since giving birth. MOB expressed feeling good. CSW inquired about mental health history. MOB disclosed she was diagnosed with depression and anxiety this year due to life changes. MOB shared she started seeing a therapist in April and will continue to see them postpartum. MOB shared therapy has been helpful as she tries to implement her learned coping skills. MOB shared she is learning to be more self-compassionate and that crying is good.  CSW praised MOB for her efforts. CSW provided education regarding the baby blues period vs. perinatal mood disorders, discussed treatment. CSW recommended MOB complete a self-evaluation during the postpartum time period using the New Mom Checklist from Postpartum Progress and encouraged MOB to contact a medical professional if symptoms are noted at any time. MOB reported she feels comfortable reaching out to her provider if she has concerns. CSW assessed MOB for safety. MOB denied thoughts of harm to self and others. CSW inquired about MOB supports. MOB acknowledged FOB, her mom and entire family as supports.   CSW provided review of Sudden Infant Death Syndrome (SIDS) precautions. MOB reported she has essential items for the infant including a car seat and a bassinet where the infant will sleep. MOB has chosen [employer] [facility] for infants follow up care. MOB reported she receives [facility] benefits. CSW assessed MOB for additional needs. MOB reported no further needs.   CSW identifies no further need for intervention and no barriers to discharge at this time.   Kathrin Greathouse, MSW, LCSW Women's and  [place] Worker  [phone] 04-09-2021  2:38 PM

## 2021-04-11 NOTE — Anesthesia Postprocedure Evaluation (Signed)
Anesthesia Post Note  Patient: Tanya Medina  Procedure(s) Performed: AN AD HOC LABOR EPIDURAL     Patient location during evaluation: Mother Baby Anesthesia Type: Epidural Level of consciousness: awake and alert Pain management: pain level controlled Vital Signs Assessment: post-procedure vital signs reviewed and stable Respiratory status: spontaneous breathing, nonlabored ventilation and respiratory function stable Cardiovascular status: stable Postop Assessment: no headache, no backache, epidural receding, no apparent nausea or vomiting, patient able to bend at knees, adequate PO intake and able to ambulate Anesthetic complications: no   No notable events documented.  Last Vitals:  Vitals:   04/11/21 0815 04/11/21 1220  BP: 110/70 (!) 106/57  Pulse: 95 93  Resp:    Temp: 36.9 C 36.9 C  SpO2: 99% 99%    Last Pain:  Vitals:   04/11/21 1738  TempSrc:   PainSc: 0-No pain   Pain Goal:                   [place]

## 2021-04-11 NOTE — Plan of Care (Signed)
  Problem: Education: Goal: Knowledge of Childbirth will improve Outcome: Completed/Met Goal: Ability to make informed decisions regarding treatment and plan of care will improve Outcome: Completed/Met Goal: Ability to state and carry out methods to decrease the pain will improve Outcome: Completed/Met Goal: Individualized Educational Video(s) Outcome: Completed/Met

## 2021-04-11 NOTE — Progress Notes (Signed)
Mom was hungry. Is eating a sandwich and drinking juice

## 2021-04-11 NOTE — Lactation Note (Signed)
This note was copied from a baby's chart. Lactation Consultation Note  Patient Name: Girl Jaleeya Mcnelly [id number] Date: 04/11/2021 Reason for consult: Mother's request;Difficult latch;Follow-up assessment;1st time breastfeeding;Term Age:19 hours Per mom, infant has not been latching well at the breast, mom has been doing a lot of skin to skin and she hand expressed only once. LC observed mom has flat nipples, LC asked mom to do breast stimulation ( hand express) prior to latching infant at the breast. LC did suck training prior to latching infant and infant was given 3 mls of colostrum and started cuing to breastfeed. Mom latched infant on her left breast using the football hold position, infant sustained latch and breastfeed for 15 minutes. Afterwards dad was taught to assist mom with hand expression and mom expressed another 3 mls of colostrum that was spoon fed to infant.  Mom was given hand pump to pre-pump breast prior to latching infant at the breast, mom knows she can hand express or pre-pump breast to help evert nipple shaft out more to help with infant latching at the breast. Mom's plan: 1- Mom will pre-pump with hand pump  or hand express prior to latching infant at the breast. 2-Mom will continue to breastfeed infant according to feeding cues, 8 to 12 or more times within 24 hours, skin to skin. 3- Mom know she can hand express after latching infant at the breast and give back extra volume of colostrum if she chooses. 4- Mom know to call RN/LC if she needs further assistance with latching infant at the breast. Maternal Data    Feeding Mother's Current Feeding Choice: Breast Milk and Formula  LATCH Score Latch: Grasps breast easily, tongue down, lips flanged, rhythmical sucking.  Audible Swallowing: Spontaneous and intermittent  Type of Nipple: Flat  Comfort (Breast/Nipple): Soft / non-tender  Hold (Positioning): Assistance needed to correctly position infant at breast  and maintain latch.  LATCH Score: 8   Lactation Tools Discussed/Used Tools: Pump Breast pump type: Manual Pump Education: Setup, frequency, and cleaning;Milk Storage Reason for Pumping: Mom will pre-pump breast prior to latching infant due to having flat nipples. Pumping frequency: pre-pump prior to latching infant at the breast.  Interventions Interventions: Assisted with latch;Skin to skin;Adjust position;Breast compression;Support pillows;Position options;Hand express;Expressed milk;Pre-pump if needed;Hand pump  Discharge    Consult Status Consult Status: Follow-up Date: 04/12/21 Follow-up type: In-patient    Danelle Earthly 04/11/2021, 9:21 PM

## 2021-04-11 NOTE — Anesthesia Procedure Notes (Addendum)
Epidural Patient location during procedure: OB Start time: 04/11/2021 12:47 AM End time: 04/11/2021 12:50 AM  Staffing Anesthesiologist: Kaylyn Layer, MD Performed: anesthesiologist   Preanesthetic Checklist Completed: patient identified, IV checked, risks and benefits discussed, monitors and equipment checked, pre-op evaluation and timeout performed  Epidural Patient position: sitting Prep: DuraPrep and site prepped and draped Patient monitoring: continuous pulse ox, blood pressure and heart rate Approach: midline Location: L3-L4 Injection technique: LOR air  Needle:  Needle type: Tuohy  Needle gauge: 17 G Needle length: 9 cm Catheter type: closed end flexible Catheter size: 19 Gauge Catheter at skin depth: 9 cm Test dose: negative and Other (1% lidocaine)  Assessment Events: blood not aspirated, injection not painful, no injection resistance, no paresthesia and negative IV test  Additional Notes Patient identified. Risks, benefits, and alternatives discussed with patient including but not limited to bleeding, infection, nerve damage, paralysis, failed block, incomplete pain control, headache, blood pressure changes, nausea, vomiting, reactions to medication, itching, and postpartum back pain. Confirmed with bedside nurse the patient's most recent platelet count. Confirmed with patient that they are not currently taking any anticoagulation, have any bleeding history, or any family history of bleeding disorders. Patient expressed understanding and wished to proceed. All questions were answered. Sterile technique was used throughout the entire procedure. Please see nursing notes for vital signs.   Crisp LOR on first pass. Mild lumbar scoliosis noted as on imaging. Test dose was given through epidural catheter and negative prior to continuing to dose epidural or start infusion. Warning signs of high block given to the patient including shortness of breath, tingling/numbness in hands,  complete motor block, or any concerning symptoms with instructions to call for help. Patient was given instructions on fall risk and not to get out of bed. All questions and concerns addressed with instructions to call with any issues or inadequate analgesia.  Reason for block:procedure for pain

## 2021-04-11 NOTE — Lactation Note (Signed)
This note was copied from a baby's chart. Lactation Consultation Note  Patient Name: Tanya Medina RTMYT'R Date: 04/11/2021 Reason for consult: Follow-up assessment;1st time breastfeeding;Primapara;Term Age:19 hours   LC Follow Up Consult:  Mother requested latch assistance.  Baby awake and beginning to show feeding cues.  Asked mother to hand express colostrum drops; mother's technique is improving.  Drops finger fed to baby.  Baby has a tight suck and is a tongue sucker.  Exercises shown to mother to help with bringing tongue down prior to latching.  Attempted x 2 to latch.  Baby was unable to latch effectively due to continuing to suck her tongue.  Fed more colostrum drops to baby.  Placed her STS on mother's chest.  Mother will continue to practice hand expression and feed drops to baby.   Maternal Data    Feeding Mother's Current Feeding Choice: Breast Milk  LATCH Score Latch: Too sleepy or reluctant, no latch achieved, no sucking elicited.  Audible Swallowing: None  Type of Nipple: Everted at rest and after stimulation  Comfort (Breast/Nipple): Soft / non-tender  Hold (Positioning): Assistance needed to correctly position infant at breast and maintain latch.  LATCH Score: 5   Lactation Tools Discussed/Used    Interventions Interventions: Breast feeding basics reviewed;Support pillows;Assisted with latch;Skin to skin;Breast massage;Hand express;Adjust position;Education  Discharge    Consult Status Consult Status: Follow-up Date: 04/12/21 Follow-up type: In-patient    Shanel Prazak R Soley Harriss 04/11/2021, 12:18 PM

## 2021-04-12 LAB — CBC
HCT: 30.1 % — ABNORMAL LOW (ref 36.0–46.0)
Hemoglobin: 10.5 g/dL — ABNORMAL LOW (ref 12.0–15.0)
MCH: 35.7 pg — ABNORMAL HIGH (ref 26.0–34.0)
MCHC: 34.9 g/dL (ref 30.0–36.0)
MCV: 102.4 fL — ABNORMAL HIGH (ref 80.0–100.0)
Platelets: 211 10*3/uL (ref 150–400)
RBC: 2.94 MIL/uL — ABNORMAL LOW (ref 3.87–5.11)
RDW: 13.8 % (ref 11.5–15.5)
WBC: 19.4 10*3/uL — ABNORMAL HIGH (ref 4.0–10.5)
nRBC: 0 % (ref 0.0–0.2)

## 2021-04-12 NOTE — Progress Notes (Signed)
PPD1 SVD:   S:  Pt reports feeling well. Trying to breastfeed. Flat nipples. Concern regarding baby getting enough/ Tolerating po/ Voiding without problems/ No n/v/ Bleeding is light/ Pain controlled withprescription NSAID's including ibuprofen (Motrin)  Newborn info live female   O:  A & O x 3 / VS: Blood pressure 104/66, pulse 79, temperature 98.4 F (36.9 C), resp. rate 18, height 5\' 4"  (1.626 m), weight 54 kg, last menstrual period 07/02/2020, SpO2 100 %, unknown if currently breastfeeding.  LABS:  Results for orders placed or performed during the hospital encounter of 04/10/21 (from the past 24 hour(s))  CBC     Status: Abnormal   Collection Time: 04/12/21  2:19 AM  Result Value Ref Range   WBC 19.4 (H) 4.0 - 10.5 K/uL   RBC 2.94 (L) 3.87 - 5.11 MIL/uL   Hemoglobin 10.5 (L) 12.0 - 15.0 g/dL   HCT 06/12/21 (L) 40.3 - 47.4 %   MCV 102.4 (H) 80.0 - 100.0 fL   MCH 35.7 (H) 26.0 - 34.0 pg   MCHC 34.9 30.0 - 36.0 g/dL   RDW 25.9 56.3 - 87.5 %   Platelets 211 150 - 400 K/uL   nRBC 0.0 0.0 - 0.2 %    I&O: I/O last 3 completed shifts: In: 308 [I.V.:98; Other:49.4; IV Piggyback:160.6] Out: 850 [Urine:550; Blood:300]   No intake/output data recorded.  Lungs: chest clear, no wheezing, rales, normal symmetric air entry  Heart: regular rate and rhythm, S1, S2 normal, no murmur, click, rub or gallop  Abdomen: soft . Uterus fundus firm @ umb nontender  Perineum: healing with good reapproximation slight edema  Lochia: light  Extremities:no redness or tenderness in the calves or thighs, no edema    A/P: PPD # 1/ G1P1001  Doing well  Continue routine post partum orders Contacted nurse regarding need for lactation to get help Anticipate d/c home in am

## 2021-04-12 NOTE — Lactation Note (Signed)
This note was copied from a baby's chart. Lactation Consultation Note  Patient Name: Tanya Medina HWTUU'E Date: 04/12/2021 Reason for consult: Follow-up assessment;Term;Primapara;1st time breastfeeding Age:19 hours   P1 mother whose infant is now 17 hours old.  This is a term baby at 40+3 weeks.  Mother has been supplementing with formula.  Mother has been providing a lot of formula supplementation.  Yesterday it was noted that baby was a "tongue sucker."  She had a tight grasp and was having difficulty opening her mouth for latching.  Reviewed concepts discussed yesterday and offered to assist with latching.    Asked mother to demonstrate hand expression and she can easily express colostrum drops; finger fed drops to baby.  Assisted to latch in the football hold.  With stimulation, baby began sucking but continued for only a minute.  Repeated attempts needed to latch and sustain her latch but baby continued improving with duration at the breast.  Showed father how he will need to continue assisting mother today/tonight.  Observed baby feeding for 20 minutes with gradual improvement throughout the feeding.  Mother pleased and denied pain with latching.  Praised mother for her efforts.  Stressed the importance of continuing to work on breast feeding and limiting amount of formula supplementation after feedings, if at all.  Discussed milk "coming to volume."  Encouraged mother to call her RN/LC for assistance as needed.  Placed baby STS and she remained calm after feeding.  Father and family member present.  RN updated.   Maternal Data Has patient been taught Hand Expression?: Yes Does the patient have breastfeeding experience prior to this delivery?: No  Feeding Mother's Current Feeding Choice: Breast Milk and Formula  LATCH Score Latch: Repeated attempts needed to sustain latch, nipple held in mouth throughout feeding, stimulation needed to elicit sucking reflex.  Audible  Swallowing: A few with stimulation  Type of Nipple: Everted at rest and after stimulation  Comfort (Breast/Nipple): Soft / non-tender  Hold (Positioning): Assistance needed to correctly position infant at breast and maintain latch.  LATCH Score: 7   Lactation Tools Discussed/Used    Interventions Interventions: Breast feeding basics reviewed;Assisted with latch;Skin to skin;Breast massage;Hand express;Breast compression;Adjust position;Position options;Support pillows;Education  Discharge Pump: Personal  Consult Status Consult Status: Follow-up Date: 04/13/21 Follow-up type: In-patient    Dewaun Kinzler R Abreanna Drawdy 04/12/2021, 4:59 PM

## 2021-04-13 MED ORDER — IBUPROFEN 600 MG PO TABS: 600.0000 mg | ORAL_TABLET | Freq: Four times a day (QID) | ORAL | 11 refills | Status: DC | PRN

## 2021-04-13 NOTE — Discharge Summary (Signed)
Postpartum Discharge Summary  Date of Service updated     Patient Name: Tanya Medina DOB: 05-Feb-2001 MRN: [id number]  Date of admission: 04/10/2021 Delivery date:04/11/2021  Delivering provider: Xochil Shanker  Date of discharge: 04/13/2021  Admitting diagnosis: Indication for care in labor or delivery [O75.9] Postpartum care following vaginal delivery [Z39.2] Intrauterine pregnancy: [redacted]w[redacted]d    Secondary diagnosis:  Active Problems:   Indication for care in labor or delivery   Postpartum care following vaginal delivery  Additional problems: GBS cx positive    Discharge diagnosis: Term Pregnancy Delivered                                              Post partum procedures: none Augmentation: N/A Complications: None  Hospital course: Onset of Labor With Vaginal Delivery      20y.o. yo G1P1001 at 440w3das admitted in Active Labor on 04/10/2021. Patient had an uncomplicated labor course as follows:  Membrane Rupture Time/Date: 10:45 PM ,04/10/2021   Delivery Method:Vaginal, Spontaneous  Episiotomy: None  Lacerations:  2nd degree;Vaginal;Labial;Perineal  Patient had an uncomplicated postpartum course.  She is ambulating, tolerating a regular diet, passing flatus, and urinating well. Patient is discharged home in stable condition on 04/13/21.  Newborn Data: Birth date:04/11/2021  Birth time:2:08 AM  Gender:Female  Living status:Living  Apgars:9 ,9  Weight:3305 g   Magnesium Sulfate received: No BMZ received: No Rhophylac:No MMR:No T-DaP:Given prenatally Flu: No Transfusion:No  Physical exam  Vitals:   04/12/21 0612 04/12/21 1345 04/12/21 2217 04/13/21 0517  BP: 104/66 112/75 120/80 104/63  Pulse: 79 90 86 81  Resp: 18 20 19 18   Temp: 98.4 F (36.9 C) 99.7 F (37.6 C) 97.8 F (36.6 C) 98.4 F (36.9 C)  TempSrc:   Oral Oral  SpO2: 100%  100% 100%  Weight:      Height:       General: alert, cooperative, and no distress Lochia: appropriate Uterine  Fundus: firm Incision: N/A DVT Evaluation: No cords or calf tenderness. No significant calf/ankle edema. Labs: Lab Results  Component Value Date   WBC 19.4 (H) 04/12/2021   HGB 10.5 (L) 04/12/2021   HCT 30.1 (L) 04/12/2021   MCV 102.4 (H) 04/12/2021   PLT 211 04/12/2021   CMP Latest Ref Rng & Units 01/03/2020  Glucose 70 - 99 mg/dL 101(H)  BUN 6 - 23 mg/dL 10  Creatinine 0.40 - 1.20 mg/dL 0.58  Sodium 135 - 145 mEq/L 139  Potassium 3.5 - 5.1 mEq/L 4.2  Chloride 96 - 112 mEq/L 104  CO2 19 - 32 mEq/L 27  Calcium 8.4 - 10.5 mg/dL 9.8   Edinburgh Score: Edinburgh Postnatal Depression Scale Screening Tool 04/13/2021  I have been able to laugh and see the funny side of things. 0  I have looked forward with enjoyment to things. 0  I have blamed myself unnecessarily when things went wrong. 1  I have been anxious or worried for no good reason. 0  I have felt scared or panicky for no good reason. 0  Things have been getting on top of me. 0  I have been so unhappy that I have had difficulty sleeping. 0  I have felt sad or miserable. 0  I have been so unhappy that I have been crying. 0  The thought of harming myself has occurred to me. 0  Edinburgh Postnatal Depression Scale Total 1      After visit meds:  Allergies as of 04/13/2021   No Known Allergies      Medication List     TAKE these medications    FERROUS SULFATE PO Take by mouth.   ibuprofen 600 MG tablet Commonly known as: ADVIL Take 1 tablet (600 mg total) by mouth every 6 (six) hours as needed for moderate pain.   Prenatal Complete 14-0.4 MG Tabs Take 1 tablet daily.         Discharge home in stable condition Infant Feeding: Breast Infant Disposition:home with mother Discharge instruction: per After Visit Summary and Postpartum booklet. Activity: Advance as tolerated. Pelvic rest for 6 weeks.  Diet: routine diet Anticipated Birth Control: Nexplanon Postpartum Appointment:6 weeks Additional  Postpartum F/U:  n/a Future Appointments: Future Appointments  Date Time Provider [place]  01/16/2022  3:00 PM Ria Bush, MD [facility]   Follow up Visit:  Follow-up Information     Servando Salina, MD Follow up in 6 week(s).   Specialty: Obstetrics and Gynecology Contact information: [address] [place] [address] [phone]                     04/13/2021 Marvene Staff, MD

## 2021-04-13 NOTE — Lactation Note (Signed)
This note was copied from a baby's chart. Lactation Consultation Note  Patient Name: Tanya Medina ZHGDJ'M Date: 04/13/2021 Reason for consult: Follow-up assessment;Other (Comment);Primapara (RN called for [facility] prior to their DC) Age:19 hours  Infant crying when LC entered the room.  LC offered to assist with BF.  Mom declined and said visitor would give a bottle since mom was getting ready.   While infant was fed a bottle of formula, mom asked multiple questions regarding milk storage, cluster feeding, and pumping; all answered.   Supply and demand discussed along with pacifier use.   LC encouraged mom to follow up with OP LC and join BFSG.  She states infant "is learning" when asked how the baby is feeding.   All questions addressed and mom is aware of resources.  Maternal Data    Feeding Mother's Current Feeding Choice: Breast Milk and Formula  LATCH Score Latch: Repeated attempts needed to sustain latch, nipple held in mouth throughout feeding, stimulation needed to elicit sucking reflex.  Audible Swallowing: A few with stimulation  Type of Nipple: Flat  Comfort (Breast/Nipple): Soft / non-tender  Hold (Positioning): Assistance needed to correctly position infant at breast and maintain latch.  LATCH Score: 6   Lactation Tools Discussed/Used    Interventions Interventions: Education  Discharge Discharge Education: Warning signs for feeding baby;Engorgement and breast care  Consult Status Consult Status: Complete Date: 04/13/21 Follow-up type: In-patient    Maryruth Hancock [facility] 04/13/2021, 10:16 AM

## 2021-04-13 NOTE — Discharge Instructions (Signed)
Call if temperature greater than equal to 100.4, nothing per vagina for 4-6 weeks or severe nausea vomiting, increased incisional pain , drainage or redness in the incision site, no straining with bowel movements, showers no bath °

## 2021-04-14 LAB — SURGICAL PATHOLOGY

## 2021-04-24 ENCOUNTER — Telehealth (HOSPITAL_COMMUNITY): Payer: Self-pay | Admitting: *Deleted

## 2021-04-24 NOTE — Telephone Encounter (Signed)
Mom reports feeling good. No concerns about herself. EPDS=2 [facility] score=1) Mom reports baby is well. Feeding, peeing, and pooping without difficulty. Sleeps in crib on back for safe sleep. No concerns about baby.  Duffy Rhody, RN 04-24-2021 at 12:11pm

## 2021-05-29 DIAGNOSIS — Z30017 Encounter for initial prescription of implantable subdermal contraceptive: Secondary | ICD-10-CM | POA: Diagnosis not present

## 2021-07-05 ENCOUNTER — Encounter: Payer: Self-pay | Admitting: Family Medicine

## 2021-07-14 ENCOUNTER — Ambulatory Visit (INDEPENDENT_AMBULATORY_CARE_PROVIDER_SITE_OTHER): Payer: BC Managed Care – PPO | Admitting: Family Medicine

## 2021-07-14 ENCOUNTER — Other Ambulatory Visit: Payer: Self-pay

## 2021-07-14 ENCOUNTER — Encounter: Payer: Self-pay | Admitting: Family Medicine

## 2021-07-14 VITALS — BP 118/70 | HR 98 | Temp 98.0°F | Ht 64.0 in | Wt 105.1 lb

## 2021-07-14 DIAGNOSIS — D539 Nutritional anemia, unspecified: Secondary | ICD-10-CM | POA: Diagnosis not present

## 2021-07-14 DIAGNOSIS — R202 Paresthesia of skin: Secondary | ICD-10-CM | POA: Diagnosis not present

## 2021-07-14 DIAGNOSIS — M4126 Other idiopathic scoliosis, lumbar region: Secondary | ICD-10-CM

## 2021-07-14 LAB — BASIC METABOLIC PANEL
BUN: 13 mg/dL (ref 6–23)
CO2: 26 mEq/L (ref 19–32)
Calcium: 9.6 mg/dL (ref 8.4–10.5)
Chloride: 106 mEq/L (ref 96–112)
Creatinine, Ser: 0.53 mg/dL (ref 0.40–1.20)
GFR: 134 mL/min (ref >60.00)
Glucose, Bld: 61 mg/dL — ABNORMAL LOW (ref 70–99)
Potassium: 4 mEq/L (ref 3.5–5.1)
Sodium: 140 mEq/L (ref 135–145)

## 2021-07-14 LAB — CBC WITH DIFFERENTIAL/PLATELET
Basophils Absolute: 0 10*3/uL (ref 0.0–0.1)
Basophils Relative: 0.2 % (ref 0.0–3.0)
Eosinophils Absolute: 0.1 10*3/uL (ref 0.0–0.7)
Eosinophils Relative: 0.5 % (ref 0.0–5.0)
HCT: 43.5 % (ref 36.0–49.0)
Hemoglobin: 14.3 g/dL (ref 12.0–16.0)
Lymphocytes Relative: 10.9 % — ABNORMAL LOW (ref 24.0–48.0)
Lymphs Abs: 1.7 10*3/uL (ref 0.7–4.0)
MCHC: 32.9 g/dL (ref 31.0–37.0)
MCV: 97.2 fl (ref 78.0–98.0)
Monocytes Absolute: 1 10*3/uL (ref 0.1–1.0)
Monocytes Relative: 6.8 % (ref 3.0–12.0)
Neutro Abs: 12.5 10*3/uL — ABNORMAL HIGH (ref 1.4–7.7)
Neutrophils Relative %: 81.6 % — ABNORMAL HIGH (ref 43.0–71.0)
Platelets: 292 10*3/uL (ref 150.0–575.0)
RBC: 4.47 Mil/uL (ref 3.80–5.70)
RDW: 12.7 % (ref 11.4–15.5)
WBC: 15.3 10*3/uL — ABNORMAL HIGH (ref 4.5–13.5)

## 2021-07-14 LAB — VITAMIN B12: Vitamin B-12: 753 pg/mL (ref 211–911)

## 2021-07-14 LAB — FOLATE: Folate: >23.4 ng/mL (ref >5.9)

## 2021-07-14 LAB — TSH: TSH: 2.2 u[IU]/mL (ref 0.40–5.00)

## 2021-07-14 NOTE — Assessment & Plan Note (Addendum)
Update CBC and vitamin levels.  Discussed restarting iron levels.

## 2021-07-14 NOTE — Patient Instructions (Addendum)
Continue prenatal vitamin We will check vitamin levels today.  Let me know if any worsening symptoms like weakness or foot drop, or if symptoms not improving over the next month.

## 2021-07-14 NOTE — Progress Notes (Addendum)
Patient ID: Tanya Medina, female    DOB: 2001-01-31, 20 y.o.   MRN: [id number]  This visit was conducted in person.  BP 118/70   Pulse 98   Temp 98 F (36.7 C) (Temporal)   Ht 5\' 4"  (1.626 m)   Wt 105 lb 2 oz (47.7 kg)   LMP 06/27/2021   SpO2 98%   Breastfeeding No   BMI 18.04 kg/m    CC: R leg symptoms  Subjective:   HPI: Tanya Medina is a 20 y.o. female presenting on 07/14/2021 for Numbness (C/o tingling/numbness in right heel/toes.  Started 2 mos ago, after epidural during birth of daughter. Also, c/o heaviness in right leg. )   Healthy daughter born 04/11/2021 ([name]). She did receive epidural during labor/delivery - noted only R side went numb during delivery.  Since then, noticing heaviness feeling to entire R leg along with numbness and tingling to R heel and toes. R leg can feel weak, worse with prolonged sitting/laying down.  No other leg numbness, other paresthesias, new lower back pain, fevers/chills, bowel/bladder incontinence. Intermittent headaches, not new.   She continues PNV but not iron.  Next OBGYN appt is 08/13/2020.      Relevant past medical, surgical, family and social history reviewed and updated as indicated. Interim medical history since our last visit reviewed. Allergies and medications reviewed and updated. Outpatient Medications Prior to Visit  Medication Sig Dispense Refill   Prenatal Vit-Fe Fumarate-FA (PRENATAL COMPLETE) 14-0.4 MG TABS Take 1 tablet daily. 60 tablet 0   FERROUS SULFATE PO Take by mouth.     ibuprofen (ADVIL) 600 MG tablet Take 1 tablet (600 mg total) by mouth every 6 (six) hours as needed for moderate pain. 30 tablet 11   No facility-administered medications prior to visit.     Per HPI unless specifically indicated in ROS section below Review of Systems  Objective:  BP 118/70   Pulse 98   Temp 98 F (36.7 C) (Temporal)   Ht 5\' 4"  (1.626 m)   Wt 105 lb 2 oz (47.7 kg)   LMP 06/27/2021   SpO2 98%    Breastfeeding No   BMI 18.04 kg/m   Wt Readings from Last 3 Encounters:  07/14/21 105 lb 2 oz (47.7 kg) (9 %, Z= -1.37)*  04/11/21 119 lb (54 kg) (34 %, Z= -0.42)*  01/10/21 99 lb 4 oz (45 kg) (3 %, Z= -1.83)*   * Growth percentiles are based on CDC (Girls, 2-20 Years) data.      Physical Exam Vitals and nursing note reviewed.  Constitutional:      Appearance: Normal appearance. She is not ill-appearing.  Musculoskeletal:        General: No tenderness. Normal range of motion.     Right lower leg: No edema.     Left lower leg: No edema.     Comments:  No pain midline spine No paraspinous mm tenderness Neg SLR bilaterally. No pain with int/ext rotation at hip. Neg FABER.  Skin:    General: Skin is warm and dry.     Findings: No erythema or rash.  Neurological:     General: No focal deficit present.     Mental Status: She is alert.     Sensory: Sensory deficit present.     Motor: Motor function is intact. No weakness or atrophy.     Coordination: Coordination is intact. Coordination normal.     Deep Tendon Reflexes:     Reflex Scores:  Patellar reflexes are 2+ on the right side and 2+ on the left side.      Achilles reflexes are 2+ on the right side and 2+ on the left side.    Comments:  2+ DTRs to bilat patella and achilles 5/5 strength to BLE testing including ankle dorsi/plantar flexion Diminished sensation to light touch at dorsal mid foot and at heel on right Able to heel and toe walk  Psychiatric:        Mood and Affect: Mood normal.        Behavior: Behavior normal.      Results for orders placed or performed during the hospital encounter of 04/10/21  CBC  Result Value Ref Range   WBC 15.3 (H) 4.0 - 10.5 K/uL   RBC 4.04 3.87 - 5.11 MIL/uL   Hemoglobin 14.2 12.0 - 15.0 g/dL   HCT 24.2 35.3 - 61.4 %   MCV 103.5 (H) 80.0 - 100.0 fL   MCH 35.1 (H) 26.0 - 34.0 pg   MCHC 34.0 30.0 - 36.0 g/dL   RDW 43.1 54.0 - 08.6 %   Platelets 252 150 - 400 K/uL   nRBC  0.0 0.0 - 0.2 %  RPR  Result Value Ref Range   RPR Ser Ql NON REACTIVE NON REACTIVE  CBC  Result Value Ref Range   WBC 19.4 (H) 4.0 - 10.5 K/uL   RBC 2.94 (L) 3.87 - 5.11 MIL/uL   Hemoglobin 10.5 (L) 12.0 - 15.0 g/dL   HCT 76.1 (L) 95.0 - 93.2 %   MCV 102.4 (H) 80.0 - 100.0 fL   MCH 35.7 (H) 26.0 - 34.0 pg   MCHC 34.9 30.0 - 36.0 g/dL   RDW 67.1 24.5 - 80.9 %   Platelets 211 150 - 400 K/uL   nRBC 0.0 0.0 - 0.2 %  Fern Test  Result Value Ref Range   POCT Fern Test Positive = ruptured amniotic membanes   Type and screen [facility]  Result Value Ref Range   ABO/RH(D) O POS    Antibody Screen NEG    Sample Expiration      04/13/2021,2359 Performed at [facility], [address] [place], [place] [address]   Surgical pathology  Result Value Ref Range   SURGICAL PATHOLOGY      SURGICAL PATHOLOGY CASE: [id number] PATIENT: Tanya Medina Surgical Pathology Report     Clinical History: None provided   FINAL MICROSCOPIC DIAGNOSIS:  A. PLACENTA, THIRD TRIMESTER: -  Mature placenta (499 g) with organizing thrombus, villous dysmaturity and acute chorioamnionitis -  Three vessel umbilical cord   GROSS DESCRIPTION:  Specimen received: Placenta including membranes and umbilical cord, singleton, received fresh. Size and shape: Discoid, 16 x 15 cm and is up to 3.1 cm thick. Weight: 499 g Umbilical cord: 22 cm in length, averages 0.9 cm in diameter, peripherally inserted and has normal tri-vasculature. Membranes: Scattered cloudiness, inserted marginally. Fetal surface: Smooth pink bluegray with mild to moderate subchorionic fibrin deposition. Maternal surface: Complete, with mild fibrin and calcium deposition. Cut surface: There is pink-red spongy parenchyma with a 1.8 cm pink-white to dark red well-defined soft to rubbery lesion . Block summary: Block 1 = membranes and cord Blocks 2, 3 = placenta Block 4 = section of lesion  SW  04/11/2021    Final Diagnosis performed by Manning Charity, MD.   Electronically signed 04/14/2021 Technical component performed at [facility], [address] [place], [place] [address].  Professional component performed at [employer]. [facility], [address], [place], [place] [address].  Immunohistochemistry Technical component (if applicable) was performed at [facility]. [address], [place], [place] [address].   IMMUNOHISTOCHEMISTRY DISCLAIMER (if applicable): Some of these immunohistochemical stains may have been developed and the performance characteristics determine by [facility]. Some may not have been cleared or approved by the U.S. Food and Drug Administration. The FDA has determined that such clearance or approval is not necessary. This test is used for clinical purposes. It should n ot be regarded as investigational or for research. This laboratory is certified under the Clinical Laboratory Improvement Amendments of 1988 (CLIA-88) as qualified to perform high complexity clinical laboratory testing.  The controls stained appropriately.     Assessment & Plan:  This visit occurred during the SARS-CoV-2 public health emergency.  Safety protocols were in place, including screening questions prior to the visit, additional usage of staff PPE, and extensive cleaning of exam room while observing appropriate contact time as indicated for disinfecting solutions.   Problem List Items Addressed This Visit     Idiopathic scoliosis of lumbar region    H/o this. Doubt related, consider updated lumbar imaging if ongoing symptoms.       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  Overall reassuring exam with preserved strength and reflexes although she does have evidence of decreased sensation to R foot  exam.  ?possible nerve injury from delivery vs less likely epidural complication.  She did have macrocytic anemia during L&D hospital stay - will update labs including vitamin levels that could explain slow recovery.       Relevant Orders   Vitamin B12   Folate   Vitamin B1   TSH   CBC with Differential/Platelet   Basic metabolic panel   Macrocytic anemia    Update CBC and vitamin levels.  Discussed restarting iron levels.       Relevant Orders   Vitamin B12   Folate   CBC with Differential/Platelet     No orders of the defined types were placed in this encounter.  Orders Placed This Encounter  Procedures   Vitamin B12   Folate   Vitamin B1   TSH   CBC with Differential/Platelet   Basic metabolic panel     Patient Instructions  Continue prenatal vitamin We will check vitamin levels today.  Let me know if any worsening symptoms like weakness or foot drop, or if symptoms not improving over the next month.   Follow up plan: Return if symptoms worsen or fail to improve.  Eustaquio Boyden, MD

## 2021-07-14 NOTE — Assessment & Plan Note (Signed)
H/o this. Doubt related, consider updated lumbar imaging if ongoing symptoms.

## 2021-07-14 NOTE — Assessment & Plan Note (Addendum)
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  Overall reassuring exam with preserved strength and reflexes although she does have evidence of decreased sensation to R foot exam.  ?possible nerve injury from delivery vs less likely epidural complication.  She did have macrocytic anemia during L&D hospital stay - will update labs including vitamin levels that could explain slow recovery.

## 2021-07-17 ENCOUNTER — Encounter: Payer: Self-pay | Admitting: Family Medicine

## 2021-07-18 ENCOUNTER — Encounter: Payer: Self-pay | Admitting: Family Medicine

## 2021-07-24 LAB — VITAMIN B1: Vitamin B1 (Thiamine): 32 nmol/L — ABNORMAL HIGH (ref 8–30)

## 2021-09-16 DIAGNOSIS — Z113 Encounter for screening for infections with a predominantly sexual mode of transmission: Secondary | ICD-10-CM | POA: Diagnosis not present

## 2021-09-16 DIAGNOSIS — Z01419 Encounter for gynecological examination (general) (routine) without abnormal findings: Secondary | ICD-10-CM | POA: Diagnosis not present

## 2021-09-16 DIAGNOSIS — Z975 Presence of (intrauterine) contraceptive device: Secondary | ICD-10-CM | POA: Diagnosis not present

## 2022-01-16 ENCOUNTER — Encounter: Payer: BC Managed Care – PPO | Admitting: Family Medicine

## 2022-03-16 ENCOUNTER — Encounter: Payer: Self-pay | Admitting: Pediatrics

## 2022-05-03 DIAGNOSIS — R197 Diarrhea, unspecified: Secondary | ICD-10-CM | POA: Diagnosis not present

## 2022-05-03 DIAGNOSIS — R110 Nausea: Secondary | ICD-10-CM | POA: Diagnosis not present

## 2022-05-21 ENCOUNTER — Other Ambulatory Visit: Payer: Self-pay

## 2022-05-21 ENCOUNTER — Emergency Department (HOSPITAL_COMMUNITY)
Admission: EM | Admit: 2022-05-21 | Discharge: 2022-05-22 | Discharge status: Against Medical Advice | Payer: BC Managed Care – PPO | Attending: Student | Admitting: Student

## 2022-05-21 ENCOUNTER — Emergency Department (HOSPITAL_COMMUNITY): Payer: BC Managed Care – PPO

## 2022-05-21 ENCOUNTER — Encounter (HOSPITAL_COMMUNITY): Payer: Self-pay | Admitting: Emergency Medicine

## 2022-05-21 DIAGNOSIS — R0789 Other chest pain: Secondary | ICD-10-CM | POA: Diagnosis not present

## 2022-05-21 DIAGNOSIS — Z5321 Procedure and treatment not carried out due to patient leaving prior to being seen by health care provider: Secondary | ICD-10-CM | POA: Diagnosis not present

## 2022-05-21 DIAGNOSIS — R072 Precordial pain: Secondary | ICD-10-CM | POA: Diagnosis not present

## 2022-05-21 DIAGNOSIS — R202 Paresthesia of skin: Secondary | ICD-10-CM | POA: Diagnosis not present

## 2022-05-21 DIAGNOSIS — R079 Chest pain, unspecified: Secondary | ICD-10-CM | POA: Diagnosis not present

## 2022-05-21 LAB — CBC
HCT: 43 % (ref 36.0–46.0)
Hemoglobin: 14.4 g/dL (ref 12.0–15.0)
MCH: 32.1 pg (ref 26.0–34.0)
MCHC: 33.5 g/dL (ref 30.0–36.0)
MCV: 96 fL (ref 80.0–100.0)
Platelets: 317 10*3/uL (ref 150–400)
RBC: 4.48 MIL/uL (ref 3.87–5.11)
RDW: 12.1 % (ref 11.5–15.5)
WBC: 8.9 10*3/uL (ref 4.0–10.5)
nRBC: 0 % (ref 0.0–0.2)

## 2022-05-21 NOTE — ED Provider Triage Note (Signed)
Emergency Medicine Provider Triage Evaluation Note  Tanya Medina , a 21 y.o. female  was evaluated in triage.  Pt complains of chest pain which has been ongoing for 1 week.  When asked to describe the location appears to be substernal.  Patient describes as a burning.  States it is worse after eating.  Denies shortness of breath, nausea, vomiting.  Does endorse vague tingling in the left arm with no known injury.  Grip strength equal bilaterally  Review of Systems  Positive: As above Negative: As above  Physical Exam  BP 120/77   Pulse 93   Temp 98.2 F (36.8 C) (Oral)   Resp 19   SpO2 100%  Gen:   Awake, no distress   Resp:  Normal effort  MSK:   Moves extremities without difficulty  Other:    Medical Decision Making  Medically screening exam initiated at 9:37 PM.  Appropriate orders placed.  Comfort Iversen was informed that the remainder of the evaluation will be completed by another provider, this initial triage assessment does not replace that evaluation, and the importance of remaining in the ED until their evaluation is complete.     Ronny Bacon 05/21/22 2139

## 2022-05-21 NOTE — ED Triage Notes (Signed)
Patient reports intermittent mid chest pain for several days , no SOB , denies emesis or cough .

## 2022-05-22 ENCOUNTER — Encounter: Payer: Self-pay | Admitting: Family Medicine

## 2022-05-22 ENCOUNTER — Ambulatory Visit: Payer: BC Managed Care – PPO | Admitting: Family Medicine

## 2022-05-22 VITALS — BP 114/66 | HR 82 | Temp 97.7°F | Ht 64.0 in | Wt 104.1 lb

## 2022-05-22 DIAGNOSIS — R079 Chest pain, unspecified: Secondary | ICD-10-CM | POA: Diagnosis not present

## 2022-05-22 DIAGNOSIS — R1013 Epigastric pain: Secondary | ICD-10-CM

## 2022-05-22 DIAGNOSIS — M546 Pain in thoracic spine: Secondary | ICD-10-CM

## 2022-05-22 DIAGNOSIS — Z23 Encounter for immunization: Secondary | ICD-10-CM | POA: Diagnosis not present

## 2022-05-22 LAB — BASIC METABOLIC PANEL
Anion gap: 16 — ABNORMAL HIGH (ref 5–15)
BUN: 13 mg/dL (ref 6–20)
CO2: 21 mmol/L — ABNORMAL LOW (ref 22–32)
Calcium: 9.9 mg/dL (ref 8.9–10.3)
Chloride: 106 mmol/L (ref 98–111)
Creatinine, Ser: 0.6 mg/dL (ref 0.44–1.00)
GFR, Estimated: >60 mL/min (ref >60)
Glucose, Bld: 99 mg/dL (ref 70–99)
Potassium: 3.7 mmol/L (ref 3.5–5.1)
Sodium: 143 mmol/L (ref 135–145)

## 2022-05-22 LAB — HEPATIC FUNCTION PANEL
ALT: 12 U/L (ref 0–35)
AST: 16 U/L (ref 0–37)
Albumin: 4.8 g/dL (ref 3.5–5.2)
Alkaline Phosphatase: 65 U/L (ref 39–117)
Bilirubin, Direct: 0.2 mg/dL (ref 0.0–0.3)
Total Bilirubin: 1.3 mg/dL — ABNORMAL HIGH (ref 0.2–1.2)
Total Protein: 7.8 g/dL (ref 6.0–8.3)

## 2022-05-22 LAB — TROPONIN I (HIGH SENSITIVITY)
Troponin I (High Sensitivity): 6 ng/L (ref <18)
Troponin I (High Sensitivity): <2 ng/L (ref <18)

## 2022-05-22 LAB — LIPASE: Lipase: 23 U/L (ref 11.0–59.0)

## 2022-05-22 MED ORDER — ALUM & MAG HYDROXIDE-SIMETH 200-200-20 MG/5ML PO SUSP
30.0000 mL | Freq: Once | ORAL | Status: AC
Start: 2022-05-22 — End: 2022-05-22
  Administered 2022-05-22: 30 mL via ORAL

## 2022-05-22 NOTE — Patient Instructions (Addendum)
Flu shot today  This sounds like either GI related (inflammation of stomach lining) or musculoskeletal (inflammation of chest bone.  Trial maalox today - did help with chest pain symptoms suggesting GI cause.  Start taking pepcid 20mg  over the counter once daily for 2 weeks.  For back - take tylenol 500mg  up to 3 times a day with meals  For chest wall pain - try topical voltaren gel over the counter.  Let us know how you're doing with above treatment.

## 2022-05-22 NOTE — Telephone Encounter (Signed)
I spoke with pt; chest pain comes and goes; no pain now but had pain earlier this morning. Dull pain on and off in mid chest; last night pt did have lt arm pain or burning sensation and upper back. Pt wanted to FU with Dr Darnell Level with appt today since per chart review tab pt left [place] without being seen on 05/21/22. Pt did have lab testing and EKG. Pt scheduled appt with Dr Darnell Level 05/22/22 at 10:30. UC & ED precautions given and pt voiced understanding. Sending note to Dr Darnell Level and Lattie Haw CMA.

## 2022-05-22 NOTE — Progress Notes (Signed)
Patient ID: Tanya Medina, female    DOB: 05/10/2001, 21 y.o.   MRN: [id number]  This visit was conducted in person.  BP 114/66   Pulse 82   Temp 97.7 F (36.5 C) (Temporal)   Ht 5\' 4"  (1.626 m)   Wt 104 lb 2 oz (47.2 kg)   SpO2 98%   BMI 17.87 kg/m    CC: ER f/u visit  Subjective:   HPI: Tanya Medina is a 21 y.o. female presenting on 05/22/2022 for Follow-up (Pt went on 05/21/22 at [facility] ED for chest pain.  Had labs and EKG but left before being seen by doc. )   5d h/o intermittent central substernal chest pain initially burning, now more sore, worse when bending forward. Also has noted more noticeable food when she swallows. Yesterday left arm was burning. Hasn't noted any other aggravating or alleviating factors. No inciting trauma/injury.   No pressure/tightness, not exertional, not relieved by rest.  No fevers/chills, nausea/vomiting, bowel changes, blood in stool or urine, bloating, belching or  No h/o heartburn.  No fmhx CAD.  Notes bloating after eating bread and pasta.  Aunt has celiac disease.   Caffeine - 1 soda/day. No spicy foods.  Avoids NSAIDs.  No new foods.  No alcohol use.   No recent prolonged periods of immobility.  No personal or family history of blood clots.  Seen at ER yesterday due to chest pain, workup reassuring, reviewed including TnI x2, CXR, EKG, and CBC/BMP. Left without being seen. ER records reviewed.   Nexplanon placed to left arm 05/29/2021 (1 year ago). Planning to get it removed early November. Notes dizziness, bloating, headaches, hair loss, L arm tingling since it was placed.  LMP 08/2021 - nexplanon stopped cycles.      Relevant past medical, surgical, family and social history reviewed and updated as indicated. Interim medical history since our last visit reviewed. Allergies and medications reviewed and updated. Outpatient Medications Prior to Visit  Medication Sig Dispense Refill  . etonogestrel (NEXPLANON) 68 MG  IMPL implant 1 each by Subdermal route once.    . Prenatal Vit-Fe Fumarate-FA (PRENATAL COMPLETE) 14-0.4 MG TABS Take 1 tablet daily. 60 tablet 0   No facility-administered medications prior to visit.     Per HPI unless specifically indicated in ROS section below Review of Systems  Objective:  BP 114/66   Pulse 82   Temp 97.7 F (36.5 C) (Temporal)   Ht 5\' 4"  (1.626 m)   Wt 104 lb 2 oz (47.2 kg)   SpO2 98%   BMI 17.87 kg/m   Wt Readings from Last 3 Encounters:  05/22/22 104 lb 2 oz (47.2 kg)  07/14/21 105 lb 2 oz (47.7 kg) (9 %, Z= -1.37)*  04/11/21 119 lb (54 kg) (34 %, Z= -0.42)*   * Growth percentiles are based on CDC (Girls, 2-20 Years) data.      Physical Exam Vitals and nursing note reviewed.  Constitutional:      Appearance: Normal appearance. She is not ill-appearing.  HENT:     Head: Normocephalic and atraumatic.     Mouth/Throat:     Mouth: Mucous membranes are moist.     Pharynx: Oropharynx is clear. No oropharyngeal exudate or posterior oropharyngeal erythema.  Eyes:     Extraocular Movements: Extraocular movements intact.     Pupils: Pupils are equal, round, and reactive to light.  Cardiovascular:     Rate and Rhythm: Normal rate and regular rhythm.  Pulses: Normal pulses.     Heart sounds: Normal heart sounds. No murmur heard. Pulmonary:     Effort: Pulmonary effort is normal. No respiratory distress.     Breath sounds: Normal breath sounds. No wheezing, rhonchi or rales.  Chest:     Chest wall: Tenderness present.       Comments:  Reproducible chest wall tenderness over sternum  No pain at costochondral junction Abdominal:     General: Bowel sounds are normal. There is no distension.     Palpations: Abdomen is soft. There is no mass.     Tenderness: There is abdominal tenderness (mild-mod) in the epigastric area. There is no guarding or rebound. Negative signs include Murphy's sign.     Hernia: No hernia is present.  Musculoskeletal:         General: Tenderness present.       Arms:     Right lower leg: No edema.     Left lower leg: No edema.     Comments: Discomfort to palpation midline thoracic spine as well as rhomboids and trapezius mm bilaterally with evident R rhomboid tightness on palpation  Skin:    General: Skin is warm and dry.     Findings: No rash.  Neurological:     Mental Status: She is alert.  Psychiatric:        Mood and Affect: Mood normal.        Behavior: Behavior normal.      Results for orders placed or performed during the hospital encounter of 32/35/57  Basic metabolic panel  Result Value Ref Range   Sodium 143 135 - 145 mmol/L   Potassium 3.7 3.5 - 5.1 mmol/L   Chloride 106 98 - 111 mmol/L   CO2 21 (L) 22 - 32 mmol/L   Glucose, Bld 99 70 - 99 mg/dL   BUN 13 6 - 20 mg/dL   Creatinine, Ser 0.60 0.44 - 1.00 mg/dL   Calcium 9.9 8.9 - 10.3 mg/dL   GFR, Estimated >60 >60 mL/min   Anion gap 16 (H) 5 - 15  CBC  Result Value Ref Range   WBC 8.9 4.0 - 10.5 K/uL   RBC 4.48 3.87 - 5.11 MIL/uL   Hemoglobin 14.4 12.0 - 15.0 g/dL   HCT 43.0 36.0 - 46.0 %   MCV 96.0 80.0 - 100.0 fL   MCH 32.1 26.0 - 34.0 pg   MCHC 33.5 30.0 - 36.0 g/dL   RDW 12.1 11.5 - 15.5 %   Platelets 317 150 - 400 K/uL   nRBC 0.0 0.0 - 0.2 %  Troponin I (High Sensitivity)  Result Value Ref Range   Troponin I (High Sensitivity) 6 <18 ng/L  Troponin I (High Sensitivity)  Result Value Ref Range   Troponin I (High Sensitivity) <2 <18 ng/L    Assessment & Plan:   Problem List Items Addressed This Visit   None Visit Diagnoses     Need for influenza vaccination    -  Primary   Relevant Orders   Flu Vaccine QUAD 59mo+IM (Fluarix, Fluzone & Alfiuria Quad PF)        No orders of the defined types were placed in this encounter.  Orders Placed This Encounter  Procedures  . Flu Vaccine QUAD 66mo+IM (Fluarix, Fluzone & Alfiuria Quad PF)     ***Follow up plan: No follow-ups on file.  Ria Bush, MD

## 2022-05-22 NOTE — ED Notes (Signed)
Pt called for vitals x3 with no answer 

## 2022-05-24 DIAGNOSIS — R079 Chest pain, unspecified: Secondary | ICD-10-CM | POA: Insufficient documentation

## 2022-05-24 DIAGNOSIS — R1013 Epigastric pain: Secondary | ICD-10-CM | POA: Insufficient documentation

## 2022-05-24 DIAGNOSIS — M546 Pain in thoracic spine: Secondary | ICD-10-CM | POA: Insufficient documentation

## 2022-05-24 NOTE — Assessment & Plan Note (Addendum)
Chest discomfort associated with epigastric discomfort over the past 5 days.  She had labs, CXR, EKG done at ER but left without being seen.  Story/exam suspicious for GI related symptoms (gastritis vs GERD) or musculoskeletal cause (?sternalis syndrome). Maalox in office significantly helped symptoms.  Recommend start pepcid, as well as topical voltaren and tylenol 500mg  TID.  Update with effect.

## 2022-05-24 NOTE — Assessment & Plan Note (Signed)
Check labs including lipase, LFTs.

## 2022-05-24 NOTE — Assessment & Plan Note (Signed)
?  MSK cause - rhomboid strain. rec heating pad, gentle stretching, tylenol.  H/o lumbar scoliosis, double contributory.

## 2022-06-01 LAB — CELIAC PNL 2 RFLX ENDOMYSIAL AB TTR
(tTG) Ab, IgA: 1.1 U/mL
(tTG) Ab, IgG: <1 U/mL
Endomysial Ab IgA: NEGATIVE
Gliadin IgA: 2.9 U/mL
Gliadin IgG: 2.8 U/mL
Immunoglobulin A: 454 mg/dL — ABNORMAL HIGH (ref 47–310)

## 2022-06-04 DIAGNOSIS — Z3046 Encounter for surveillance of implantable subdermal contraceptive: Secondary | ICD-10-CM | POA: Diagnosis not present

## 2022-07-27 ENCOUNTER — Other Ambulatory Visit: Payer: Self-pay | Admitting: Family Medicine

## 2022-07-27 DIAGNOSIS — Z13220 Encounter for screening for lipoid disorders: Secondary | ICD-10-CM

## 2022-07-27 DIAGNOSIS — L659 Nonscarring hair loss, unspecified: Secondary | ICD-10-CM

## 2022-07-27 DIAGNOSIS — Z1159 Encounter for screening for other viral diseases: Secondary | ICD-10-CM

## 2022-07-27 DIAGNOSIS — R1013 Epigastric pain: Secondary | ICD-10-CM

## 2022-07-29 ENCOUNTER — Other Ambulatory Visit (INDEPENDENT_AMBULATORY_CARE_PROVIDER_SITE_OTHER): Payer: BC Managed Care – PPO

## 2022-07-29 DIAGNOSIS — Z1159 Encounter for screening for other viral diseases: Secondary | ICD-10-CM

## 2022-07-29 DIAGNOSIS — Z13220 Encounter for screening for lipoid disorders: Secondary | ICD-10-CM

## 2022-07-29 DIAGNOSIS — L659 Nonscarring hair loss, unspecified: Secondary | ICD-10-CM | POA: Diagnosis not present

## 2022-07-29 DIAGNOSIS — R1013 Epigastric pain: Secondary | ICD-10-CM

## 2022-07-29 LAB — LIPID PANEL
Cholesterol: 136 mg/dL (ref 0–200)
HDL: 46.1 mg/dL (ref >39.00)
LDL Cholesterol: 72 mg/dL (ref 0–99)
NonHDL: 89.54
Total CHOL/HDL Ratio: 3
Triglycerides: 87 mg/dL (ref 0.0–149.0)
VLDL: 17.4 mg/dL (ref 0.0–40.0)

## 2022-07-29 LAB — CBC WITH DIFFERENTIAL/PLATELET
Basophils Absolute: 0 10*3/uL (ref 0.0–0.1)
Basophils Relative: 0.2 % (ref 0.0–3.0)
Eosinophils Absolute: 0.1 10*3/uL (ref 0.0–0.7)
Eosinophils Relative: 1.5 % (ref 0.0–5.0)
HCT: 41.3 % (ref 36.0–46.0)
Hemoglobin: 13.9 g/dL (ref 12.0–15.0)
Lymphocytes Relative: 44.6 % (ref 12.0–46.0)
Lymphs Abs: 2.8 10*3/uL (ref 0.7–4.0)
MCHC: 33.7 g/dL (ref 30.0–36.0)
MCV: 95.6 fl (ref 78.0–100.0)
Monocytes Absolute: 0.6 10*3/uL (ref 0.1–1.0)
Monocytes Relative: 9 % (ref 3.0–12.0)
Neutro Abs: 2.8 10*3/uL (ref 1.4–7.7)
Neutrophils Relative %: 44.7 % (ref 43.0–77.0)
Platelets: 294 10*3/uL (ref 150.0–400.0)
RBC: 4.32 Mil/uL (ref 3.87–5.11)
RDW: 12.7 % (ref 11.5–14.6)
WBC: 6.3 10*3/uL (ref 4.5–10.5)

## 2022-07-29 LAB — BASIC METABOLIC PANEL
BUN: 12 mg/dL (ref 6–23)
CO2: 27 mEq/L (ref 19–32)
Calcium: 9.2 mg/dL (ref 8.4–10.5)
Chloride: 106 mEq/L (ref 96–112)
Creatinine, Ser: 0.5 mg/dL (ref 0.40–1.20)
GFR: 134.9 mL/min (ref >60.00)
Glucose, Bld: 81 mg/dL (ref 70–99)
Potassium: 4 mEq/L (ref 3.5–5.1)
Sodium: 140 mEq/L (ref 135–145)

## 2022-07-29 LAB — TSH: TSH: 1.95 u[IU]/mL (ref 0.35–5.50)

## 2022-07-30 LAB — HEPATITIS C ANTIBODY: Hepatitis C Ab: NONREACTIVE

## 2022-08-05 ENCOUNTER — Encounter: Payer: Self-pay | Admitting: Family Medicine

## 2022-08-05 ENCOUNTER — Ambulatory Visit (INDEPENDENT_AMBULATORY_CARE_PROVIDER_SITE_OTHER): Payer: BC Managed Care – PPO | Admitting: Family Medicine

## 2022-08-05 VITALS — BP 102/52 | HR 82 | Ht 64.0 in | Wt 102.0 lb

## 2022-08-05 DIAGNOSIS — F418 Other specified anxiety disorders: Secondary | ICD-10-CM | POA: Diagnosis not present

## 2022-08-05 DIAGNOSIS — Z0000 Encounter for general adult medical examination without abnormal findings: Secondary | ICD-10-CM

## 2022-08-05 DIAGNOSIS — L659 Nonscarring hair loss, unspecified: Secondary | ICD-10-CM

## 2022-08-05 DIAGNOSIS — F419 Anxiety disorder, unspecified: Secondary | ICD-10-CM

## 2022-08-05 NOTE — Assessment & Plan Note (Addendum)
Discussed importance of healthy stress relieving measures.  Discussed option of medication vs counseling - she will let me know if interested

## 2022-08-05 NOTE — Assessment & Plan Note (Signed)
Preventative protocols reviewed and updated unless pt declined. Discussed healthy diet and lifestyle.  

## 2022-08-05 NOTE — Patient Instructions (Addendum)
Gusto verla hoy - suba agua y fruta/verdura en la dieta para una dieta mas saludable.  Work on healthy stress relieving strategies.  Regresar en 1 ao para proximo examen.   Estrs [employer] Stress, Adult El estrs es una reaccin normal a los sucesos de la vida. Es lo que se siente cuando la vida le exige ms de lo que est acostumbrado o ms de lo que cree que puede [place]. Un poco de estrs puede ser til, por ejemplo, cuando se estudia para una prueba o se debe cumplir con una fecha lmite para un trabajo. El estrs muy frecuente o que dura mucho tiempo puede causar problemas. El estrs prolongado se denomina estrs crnico. El estrs crnico puede afectar la salud emocional y [place] relaciones y las actividades cotidianas normales. El exceso de estrs puede Academic librarian sistema de defensa del cuerpo (sistema inmunitario) y aumentar el riesgo de tener enfermedades fsicas. Si ya tiene un problema mdico, el estrs puede empeorarlo. Cules son las causas? Todos los sucesos de la vida pueden causar estrs. Un suceso que le causa estrs a una persona puede no ser estresante para Theatre manager. Los sucesos ms importantes de la vida, sean positivos o negativos, suelen causar estrs. Por ejemplo: Perder un trabajo o empezar un Nicholas Lose. Perder a un ser querido. Mudarse a una casa o un barrio [place]. Casarse o divorciarse. Tener un beb. Lesionarse o enfermarse. Los sucesos de la vida menos evidentes tambin pueden causar estrs, especialmente si ocurren da tras da o combinados entre s. Por ejemplo: Trabajar muchas horas. Conducir en medio del trfico. Cuidar de los nios. Tener deudas. Estar en una relacin difcil. Cules son los signos o sntomas? El estrs puede causar sntomas emocionales y fsicos y puede dar Environmental consultant a conductas poco saludables. Estos incluyen los siguientes: Sntomas emocionales Ansiedad. Sentirse preocupado, temeroso, nervioso, abrumado o fuera de  control. Enojo, incluso irritacin o impaciencia. Depresin. Sentirse triste, decado, desesperanzado o culpable. Dificultad para concentrarse, recordar o tomar decisiones. Sntomas fsicos Dolores y Mildred. Estos pueden afectar la cabeza, el cuello, la espalda, el estmago u otras zonas del cuerpo. Rigidez muscular o tensin mandibular. Poca energa. Dificultad para dormir. Conductas poco saludables [place] para sentirse mejor (comer en exceso) o saltear comidas. Trabajar mucho o postergar tareas. Fumar, beber alcohol o consumir drogas para sentirse mejor. Cmo se diagnostica? El trastorno por estrs se diagnostica a travs de una evaluacin que realiza el mdico. Un trastorno por estrs puede diagnosticarse en funcin de lo siguiente: Sus sntomas y cualquier suceso estresante que le haya ocurrido en la vida. Sus antecedentes mdicos. Estudios para Freight forwarder causas de los sntomas. Segn la afeccin, el mdico podr derivarlo a un especialista para que le haga ms evaluaciones. Cmo se trata?  El tratamiento recomendado para el estrs son las tcnicas de control del estrs. Generalmente, no se recomiendan medicamentos para tratar el estrs. Entre las tcnicas para reducir su respuesta a los sucesos estresantes de la vida, se incluyen las siguientes: Marine scientist. Obsrvese a usted mismo para detectar sntomas de estrs e identificar sus causas. Estas habilidades pueden ayudarlo a evitar sucesos estresantes o a prepararse para ellos. Gaffer. Establezca las prioridades, lleve un calendario de los sucesos y aprenda a Software engineer "no". Estas acciones pueden ayudarle a evitar asumir demasiado. Las tcnicas para lidiar con el estrs incluyen lo siguiente: Replantearse el problema. Trate de pensar de un modo Tourist information centre manager de ignorarlos o Horticulturist, commercial.  Intente encontrar los aspectos positivos en una situacin estresante, en lugar  de enfocarse en los negativos. Actividad fsica. El ejercicio fsico puede liberar la tensin fsica y Architectural technologist. La clave es encontrar un tipo de ejercicio fsico que disfrute y practique con regularidad. Tcnicas de relajacin. Estas relajan el cuerpo y la [place]. Encuentre una o ms opciones que disfrute y practquela con regularidad. Por ejemplo: Meditacin, respiracin profunda o tcnicas de relajacin progresiva. Yoga o tai chi. Biorretroalimentacin, tcnicas de plena conciencia o llevar un diario. Escuchar msica, estar en contacto con la naturaleza o participar en otros pasatiempos. Llevar un estilo de vida saludable. Siga una dieta equilibrada, tome mucha agua, limite o evite la cafena y [place]. Tener una red de [employer]. Pase tiempo con la familia, los amigos y otras personas cuya compaa disfrute. Exprese sus sentimientos y converse acerca de las cosas que le preocupan con alguien en quien confe. La orientacin o la psicoterapia con un profesional de salud mental pueden ser de ayuda si tiene dificultades para controlar el estrs usted solo. Siga estas instrucciones en su casa: Estilo de [place] drogas. No consuma ningn producto que contenga nicotina o tabaco. Estos productos incluyen cigarrillos, tabaco para Higher education careers adviser y aparatos de vapeo, como los Psychologist, sport and exercise. Si necesita ayuda para dejar de fumar, consulte al mdico. Si bebe alcohol: Limite la cantidad que bebe a lo siguiente: De 0 a 1 medida por da para las mujeres que no estn embarazadas. De 0 a 2 medidas por da para los hombres. Sepa cunta cantidad de alcohol hay en las bebidas. En los [place], una medida equivale a una botella de cerveza de 12 oz (355 ml), un vaso de vino de 5 oz (148 ml) o un vaso de una bebida alcohlica de alta graduacin de 1 oz (44 ml). No consuma alcohol ni drogas para relajarse. Lleve una dieta equilibrada que incluya frutas y verduras frescas, cereales  integrales, carnes magras, pescado, huevos, frijoles y lcteos descremados. Evite los alimentos procesados y los alimentos con alto contenido de grasa, Location manager y sal. Sherilyn Cooter al menos 30 minutos de ejercicio 5 o ms das de la semana. Intente dormir de 7 a 8 horas todas las noches. Instrucciones generales  Practique tcnicas de control del estrs como se lo haya indicado el mdico. Beba suficiente lquido como para Theatre manager la orina de color amarillo plido. Use los medicamentos de venta libre y los recetados solamente como se lo haya indicado el mdico. Concurra a todas las visitas de seguimiento. Esto es importante. Comunquese con un mdico si: Sus sntomas empeoran. Aparecen nuevos sntomas. Se siente abrumado por los problemas y ya no puede manejarlos solo. Solicite ayuda de inmediato si: Piensa acerca de lastimarse a usted mismo o a Producer, television/film/video. Busque ayuda de inmediatosi alguna vez siente que puede hacerse dao a usted mismo o a otros, o tiene pensamientos de Doctor, hospital a su vida. Dirjase al centro de urgencias ms cercano o: Llame al 911. Llame a National Suicide Prevention Lifeline ([place] para la Prevencin del Suicidio) al [phone] o al 988. Est disponible las 24 horas del da. Enve un mensaje de texto a la lnea para casos de crisis al [phone]. Resumen El estrs es una reaccin normal a los sucesos de la vida. Puede causar problemas si es muy frecuente o dura [employer]. La mejor forma de tratar el estrs es Psychologist, prison and probation services las tcnicas de control del estrs. La orientacin o la psicoterapia con un profesional de  salud mental pueden ser de ayuda si tiene dificultades para controlar el estrs usted solo. Esta informacin no tiene Marine scientist el consejo del mdico. Asegrese de hacerle al mdico cualquier pregunta que tenga. Document Revised: 04/02/2021 Document Reviewed: 04/02/2021 Elsevier Patient Education  [employer].

## 2022-08-05 NOTE — Assessment & Plan Note (Signed)
Notes improvement while on prenatal vitamin. Suggested she start daily MVI with iron, especially as she's likely to start regular periods off Nexplanon.

## 2022-08-05 NOTE — Assessment & Plan Note (Signed)
H/o depression/anxiety during pregnancy, did improve with counseling previously.

## 2022-08-05 NOTE — Progress Notes (Signed)
Patient ID: Tanya Medina, female    DOB: 2000/09/13, 22 y.o.   MRN: [id number]  This visit was conducted in person.  BP (!) 102/52   Pulse 82   Ht 5\' 4"  (1.626 m)   Wt 102 lb (46.3 kg)   LMP 07/24/2022   SpO2 99%   Breastfeeding No   BMI 17.51 kg/m    CC: CPE Subjective:   HPI: Tanya Medina is a 22 y.o. female presenting on 08/05/2022 for Annual Exam   Tested negative for celiac disease 05/2022.  Nexplanon removed 06/2022. Feels better since it was removed.   Notes ongoing hair loss, she did note improvement when she was taking prenatal for prior pregnancy.   Preventative: Colon cancer screening - not due Lung cancer screening - not due Breast cancer screening - not due Well woman exam - sees OBGYN Dr Garwin Brothers upcoming appt 09/2022 LMP 07/24/2022 - first one since nexplanon removed  DEXA scan - not due Flu shot - yearly COVID shot - [place] 11/2019, 12/2019, booster 07/2020 Tdap 01/2021 Pneumonia shot - not due Shingrix - not due Advanced directive discussion -  Seat belt use discussed Sunscreen use discussed. No changing moles on skin.  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 Interim medical history since our last visit reviewed. Allergies and medications reviewed and updated. Outpatient Medications Prior to Visit  Medication Sig Dispense Refill   etonogestrel (NEXPLANON) 68 MG IMPL implant 1 each by Subdermal route once.     No facility-administered medications prior to visit.     Per HPI unless specifically indicated in ROS section below Review of Systems  Constitutional:  Negative for activity change, appetite change, chills, fatigue, fever  and unexpected weight change.  HENT:  Negative for hearing loss.   Eyes:  Negative for visual disturbance.  Respiratory:  Negative for cough, chest tightness, shortness of breath and wheezing.   Cardiovascular:  Negative for chest pain, palpitations and leg swelling.  Gastrointestinal:  Positive for constipation. Negative for abdominal distention, abdominal pain, blood in stool, diarrhea, nausea and vomiting.  Genitourinary:  Negative for difficulty urinating and hematuria.  Musculoskeletal:  Negative for arthralgias, myalgias and neck pain.  Skin:  Negative for rash.  Neurological:  Positive for light-headedness. Negative for dizziness, seizures, syncope and headaches.  Hematological:  Negative for adenopathy. Does not bruise/bleed easily.  Psychiatric/Behavioral:  Negative for dysphoric mood. The patient is nervous/anxious.     Objective:  BP (!) 102/52   Pulse 82   Ht 5\' 4"  (1.626 m)   Wt 102 lb (46.3 kg)   LMP 07/24/2022   SpO2 99%   Breastfeeding No   BMI 17.51 kg/m   Wt Readings from Last 3 Encounters:  08/05/22 102 lb (46.3 kg)  05/22/22 104 lb 2 oz (47.2 kg)  07/14/21 105 lb 2 oz (47.7 kg) (9 %, Z= -1.37)*   * Growth percentiles are based on CDC (Girls, 2-20 Years) data.      Physical Exam Vitals and nursing note reviewed.  Constitutional:      Appearance: Normal appearance. She is not ill-appearing.  HENT:     Head: Normocephalic and atraumatic.  Right Ear: Tympanic membrane, ear canal and external ear normal. There is no impacted cerumen.     Left Ear: Tympanic membrane, ear canal and external ear normal. There is no impacted cerumen.  Eyes:     General:        Right eye: No discharge.        Left eye: No discharge.     Extraocular Movements: Extraocular movements intact.     Conjunctiva/sclera: Conjunctivae normal.     Pupils: Pupils are equal, round, and reactive to light.  Neck:     Thyroid: No thyroid mass or thyromegaly.  Cardiovascular:     Rate  and Rhythm: Normal rate and regular rhythm.     Pulses: Normal pulses.     Heart sounds: Normal heart sounds. No murmur heard. Pulmonary:     Effort: Pulmonary effort is normal. No respiratory distress.     Breath sounds: Normal breath sounds. No wheezing, rhonchi or rales.  Abdominal:     General: Bowel sounds are normal. There is no distension.     Palpations: Abdomen is soft. There is no mass.     Tenderness: There is no abdominal tenderness. There is no guarding or rebound.     Hernia: No hernia is present.  Musculoskeletal:     Cervical back: Normal range of motion and neck supple. No rigidity.     Right lower leg: No edema.     Left lower leg: No edema.  Lymphadenopathy:     Cervical: No cervical adenopathy.  Skin:    General: Skin is warm and dry.     Findings: No rash.  Neurological:     General: No focal deficit present.     Mental Status: She is alert. Mental status is at baseline.  Psychiatric:        Mood and Affect: Mood normal.        Behavior: Behavior normal.       Results for orders placed or performed in visit on 07/29/22  CBC with Differential/Platelet  Result Value Ref Range   WBC 6.3 4.5 - 10.5 K/uL   RBC 4.32 3.87 - 5.11 Mil/uL   Hemoglobin 13.9 12.0 - 15.0 g/dL   HCT 97.9 89.2 - 11.9 %   MCV 95.6 78.0 - 100.0 fl   MCHC 33.7 30.0 - 36.0 g/dL   RDW 41.7 40.8 - 14.4 %   Platelets 294.0 150.0 - 400.0 K/uL   Neutrophils Relative % 44.7 43.0 - 77.0 %   Lymphocytes Relative 44.6 12.0 - 46.0 %   Monocytes Relative 9.0 3.0 - 12.0 %   Eosinophils Relative 1.5 0.0 - 5.0 %   Basophils Relative 0.2 0.0 - 3.0 %   Neutro Abs 2.8 1.4 - 7.7 K/uL   Lymphs Abs 2.8 0.7 - 4.0 K/uL   Monocytes Absolute 0.6 0.1 - 1.0 K/uL   Eosinophils Absolute 0.1 0.0 - 0.7 K/uL   Basophils Absolute 0.0 0.0 - 0.1 K/uL  Basic metabolic panel  Result Value Ref Range   Sodium 140 135 - 145 mEq/L   Potassium 4.0 3.5 - 5.1 mEq/L   Chloride 106 96 - 112 mEq/L   CO2 27 19 - 32 mEq/L    Glucose, Bld 81 70 - 99 mg/dL   BUN 12 6 - 23 mg/dL   Creatinine, Ser 8.18 0.40 - 1.20 mg/dL   GFR 563.14 >97.02 mL/min   Calcium 9.2 8.4 - 10.5 mg/dL  Lipid panel  Result Value Ref Range  Cholesterol 136 0 - 200 mg/dL   Triglycerides 87.0 0.0 - 149.0 mg/dL   HDL 46.10 >39.00 mg/dL   VLDL 17.4 0.0 - 40.0 mg/dL   LDL Cholesterol 72 0 - 99 mg/dL   Total CHOL/HDL Ratio 3    NonHDL 89.54   TSH  Result Value Ref Range   TSH 1.95 0.35 - 5.50 uIU/mL  Hepatitis C antibody  Result Value Ref Range   Hepatitis C Ab NON-REACTIVE NON-REACTIVE      08/05/2022    2:31 PM 01/10/2021    3:31 PM 01/03/2020    4:28 PM  Depression screen PHQ 2/9  Decreased Interest 0 0 0  Down, Depressed, Hopeless 1 0 1  PHQ - 2 Score 1 0 1  Altered sleeping 0 0 0  Tired, decreased energy 0 0 0  Change in appetite 0 0 1  Feeling bad or failure about yourself  0 0 0  Trouble concentrating 0 0 0  Moving slowly or fidgety/restless 0 0 0  Suicidal thoughts 0 0   PHQ-9 Score 1 0 2  Difficult doing work/chores Not difficult at all        08/05/2022    2:32 PM 01/10/2021    3:31 PM  GAD 7 : Generalized Anxiety Score  Nervous, Anxious, on Edge 2 1  Control/stop worrying 0 1  Worry too much - different things 0 0  Trouble relaxing 2 0  Restless 2 0  Easily annoyed or irritable 2 1  Afraid - awful might happen 2 0  Total GAD 7 Score 10 3  Anxiety Difficulty Somewhat difficult     Assessment & Plan:   Problem List Items Addressed This Visit     Health maintenance examination - Primary (Chronic)    Preventative protocols reviewed and updated unless pt declined. Discussed healthy diet and lifestyle.       Hair loss    Notes improvement while on prenatal vitamin. Suggested she start daily MVI with iron, especially as she's likely to start regular periods off Nexplanon.      Anxiety    Discussed importance of healthy stress relieving measures.  Discussed option of medication vs counseling - she will  let me know if interested       Depression with anxiety    H/o depression/anxiety during pregnancy, did improve with counseling previously.         No orders of the defined types were placed in this encounter.  No orders of the defined types were placed in this encounter.    Patient instructions: Gusto verla hoy - suba agua y fruta/verdura en la dieta para una dieta mas saludable.  Work on healthy stress relieving strategies.  Regresar en 1 ao para proximo examen.   Follow up plan: Return in about 1 year (around 08/06/2023) for annual exam, prior fasting for blood work.  Ria Bush, MD

## 2023-01-27 IMAGING — US US MFM FETAL BPP W/O NON-STRESS
1 series · 14 of 28 positions shown · non-contrast
Comparison: none

[Series 1: us mfm fetal bpp w/o non-stress · 39 acquisitions, 14 frames shown]
[im 2/39]
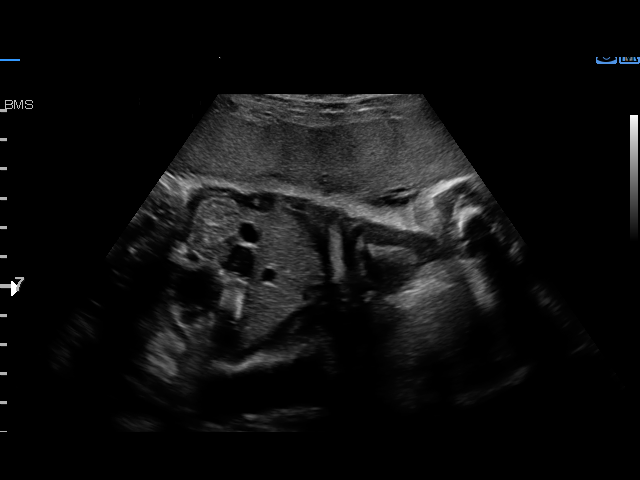
[im 5/39]
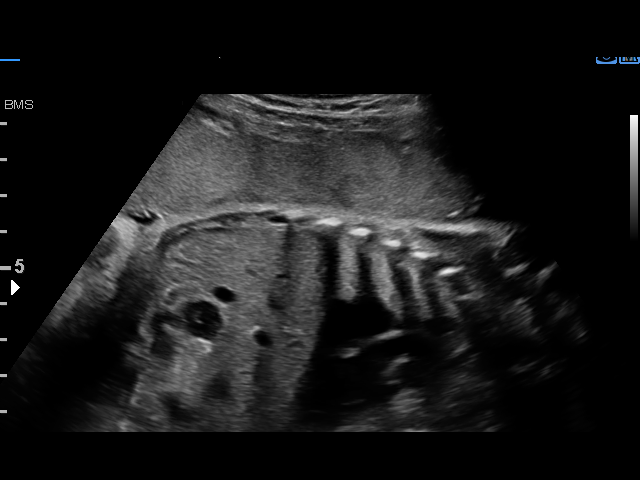
[im 8/39]
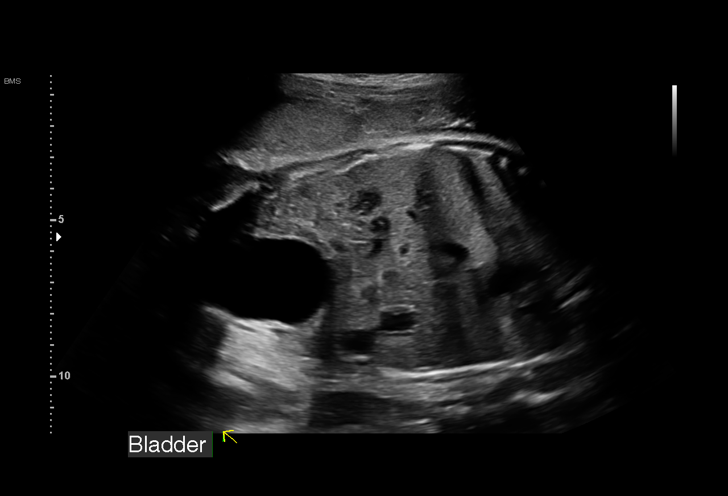
[im 10/39]
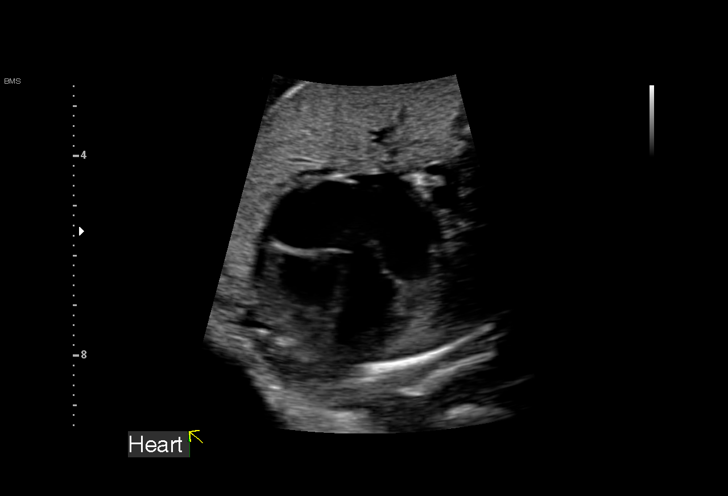
[im 13/39]
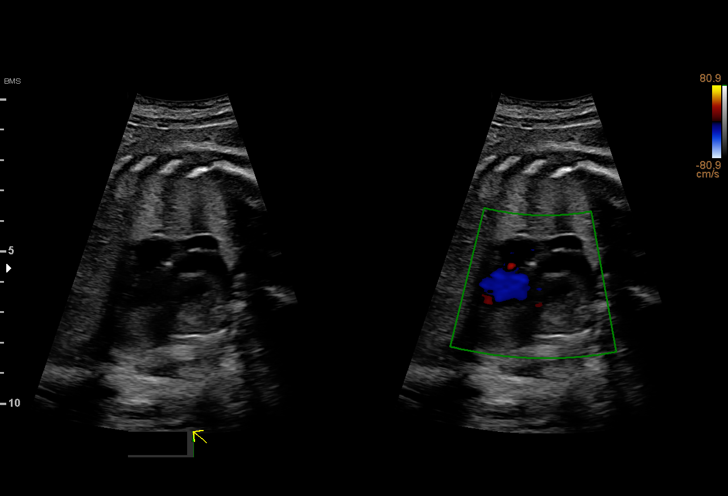
[im 16/39]
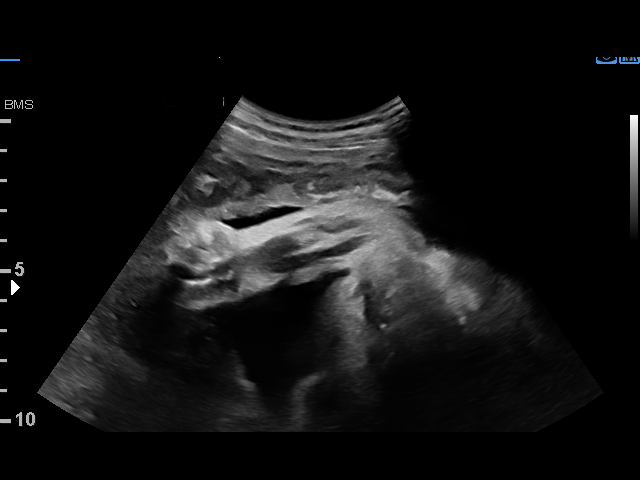
[im 19/39]
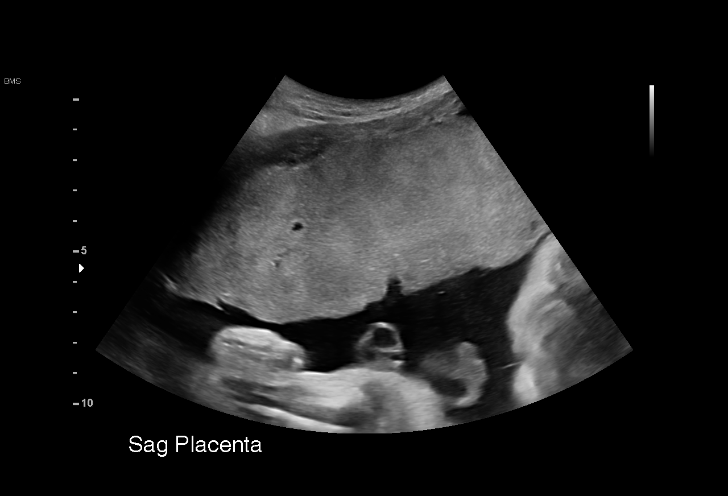
[im 22/39]
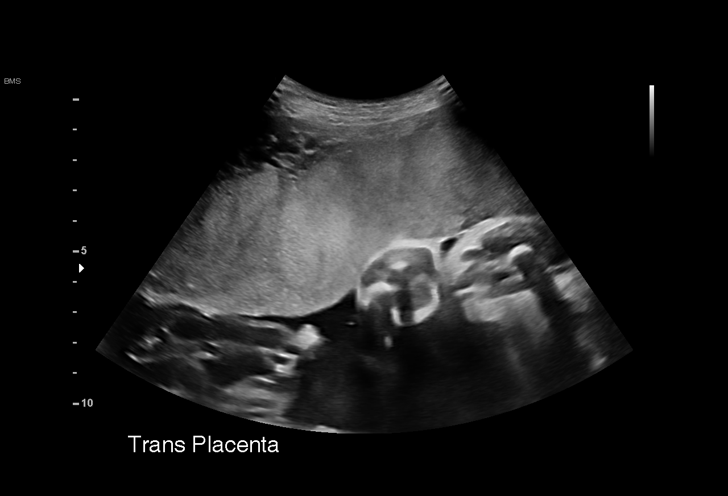
[im 24/39]
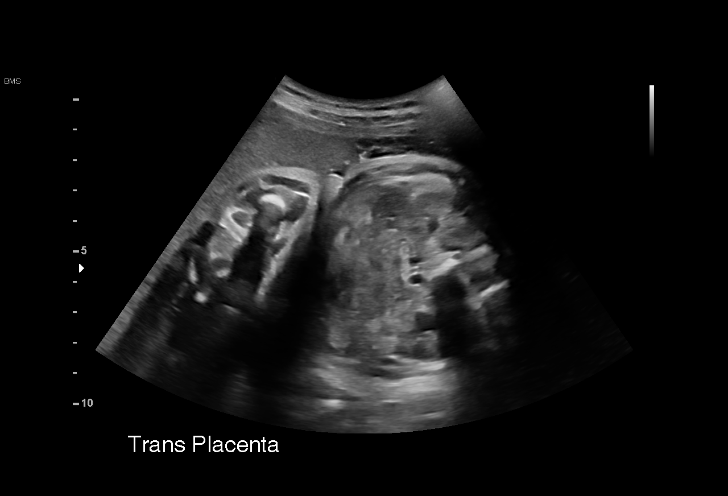
[im 27/39]
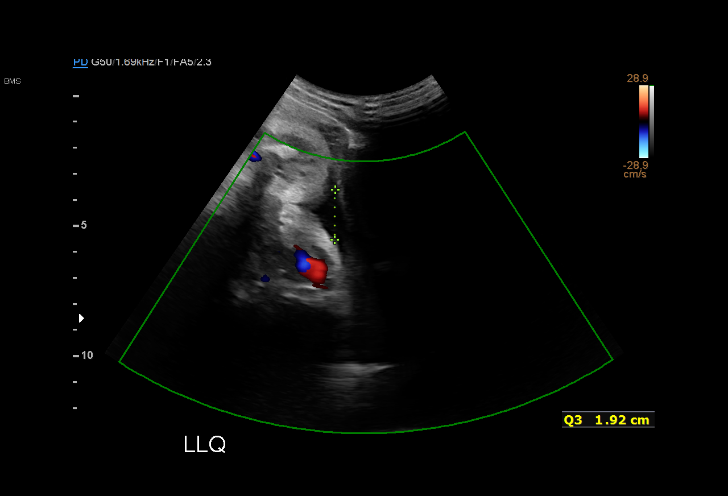
[im 30/39]
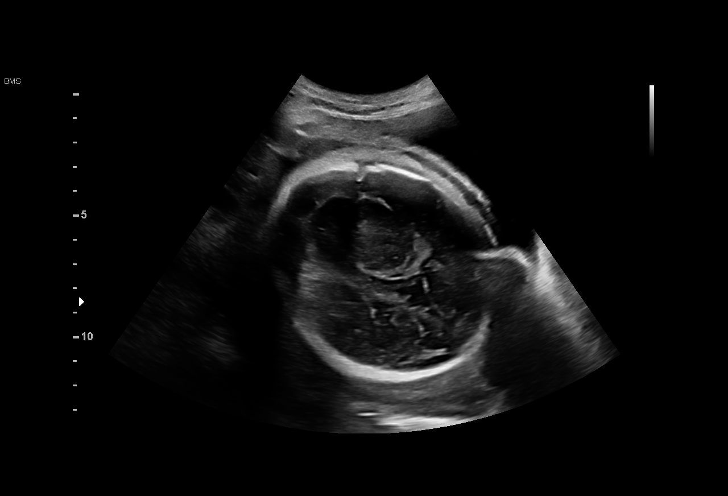
[im 33/39]
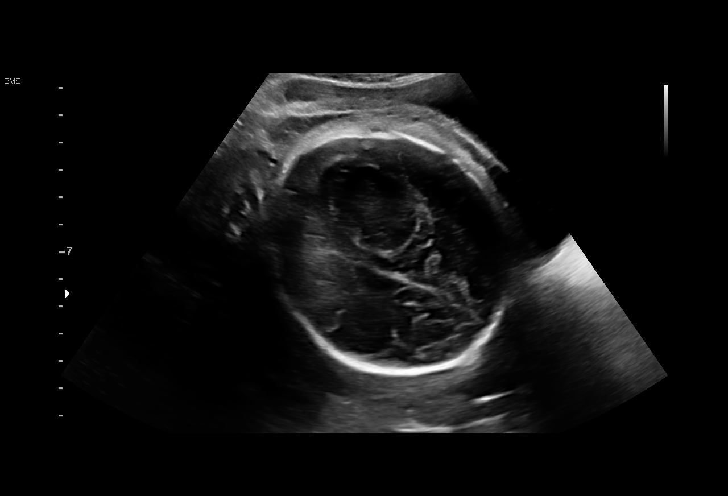
[im 36/39]
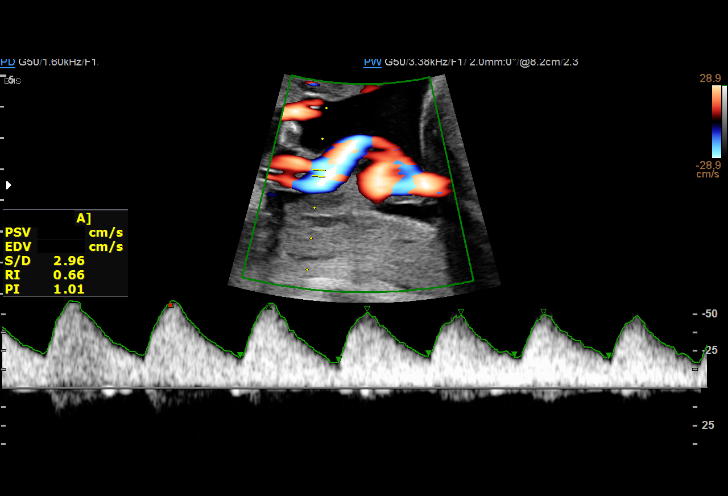
[im 39/39]
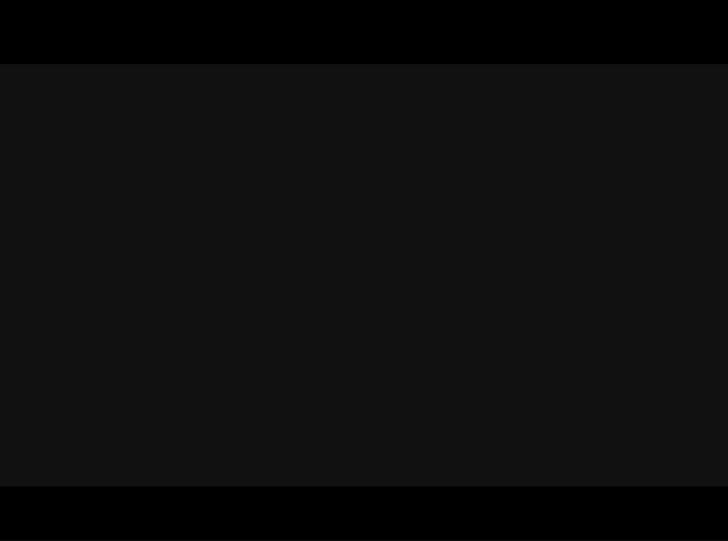

[14 of 28 positions shown; findings below may reference images not displayed]

OB/GYN

Indications

 Maternal care for known or suspected poor
 fetal growth, third trimester, fetus 1 IUGR
 Small for gestational age fetus affecting
 management of mother
 Teen pregnancy
 Low Risk NIPS(Negative AFP)
 Encounter for other antenatal screening
 follow-up
 33 weeks gestation of pregnancy
Fetal Evaluation

 Num Of Fetuses:         1
 Fetal Heart Rate(bpm):  162
 Cardiac Activity:       Observed
 Presentation:           Cephalic
 Placenta:               Anterior
 P. Cord Insertion:      Previously Visualized

 Amniotic Fluid
 AFI FV:      Within normal limits

 AFI Sum(cm)     %Tile       Largest Pocket(cm)
 13.04           41
 RUQ(cm)       RLQ(cm)       LUQ(cm)        LLQ(cm)
 4.77          6.35          0
Biophysical Evaluation

 Amniotic F.V:   Pocket => 2 cm             F. Tone:        Observed
 F. Movement:    Observed                   Score:          [DATE]
 F. Breathing:   Observed
OB History

 Gravidity:    1
Gestational Age

 LMP:           32w 6d        Date:  07/02/20                 EDD:   04/08/21
 Best:          33w 5d     Det. By:  Early Ultrasound         EDD:   04/02/21
Anatomy

 Cranium:               Previously seen        LVOT:                   Appears normal
 Cavum:                 Previously seen        Aortic Arch:            Previously seen
 Ventricles:            Previously seen        Ductal Arch:            Previously seen
 Choroid Plexus:        Previously seen        Diaphragm:              Appears normal
 Cerebellum:            Previously seen        Stomach:                Appears normal, left
                                                                       sided
 Posterior Fossa:       Previously seen        Abdomen:                Previously seen
 Nuchal Fold:           Previously seen        Abdominal Wall:         Previously seen
 Face:                  Orbits and profile     Cord Vessels:           Previously seen
                        previously seen
 Lips:                  Not well visualized    Kidneys:                Appear normal
 Palate:                Previously seen        Bladder:                Appears normal
 Thoracic:              Previously seen        Spine:                  Previously seen
 Heart:                 Appears normal         Upper Extremities:      Previously seen
                        (4CH, axis, and
                        situs)
 RVOT:                  Previously seen        Lower Extremities:      Previously seen

 Other:  Female gender previously seen. Heels and Open hands visualized
         previously. Technically difficult due to fetal position.
Doppler - Fetal Vessels

 Umbilical Artery
  S/D     %tile      RI    %tile      PI    %tile            ADFV    RDFV
  2.86       68    0.65       74    0.98       72               No      No

Cervix Uterus Adnexa

 Cervix
 Not visualized (advanced GA >09wks)
Impression

 Antenatal testing due to SGA
 Biophysical profile [DATE] with good fetal movement and
 amniotic fluid volume
 UA Dopplers are normal with no evidence of AEDF or REDF
Recommendations

 Continue weekly testing and repeat growth as previously
 scheduled.

## 2023-02-05 IMAGING — US US MFM FETAL BPP W/O NON-STRESS
1 series · 12 of 12 positions shown · non-contrast
Comparison: none

[Series 1: us mfm fetal bpp w/o non-stress · 12 acquisitions, 12 frames shown]
[im 1/12]
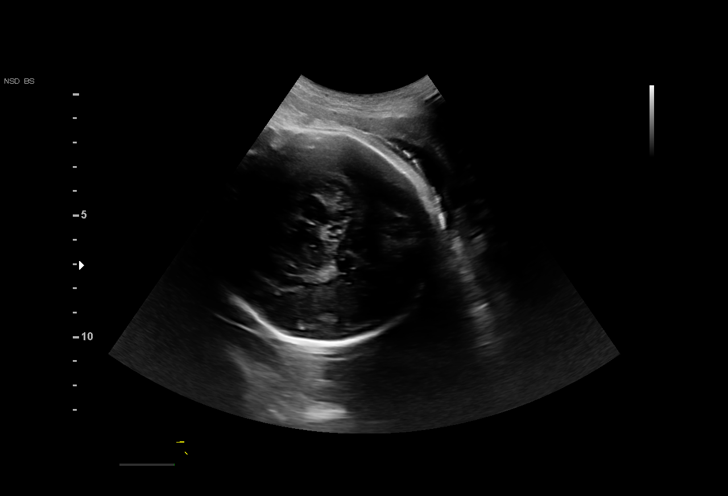
[im 2/12]
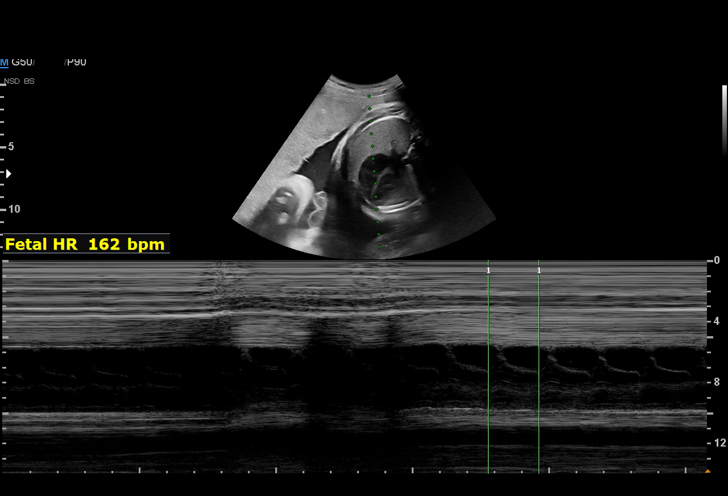
[im 3/12]
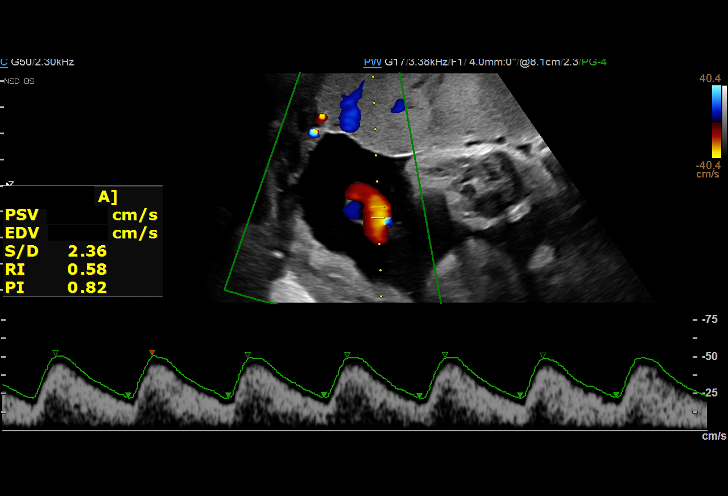
[im 4/12]
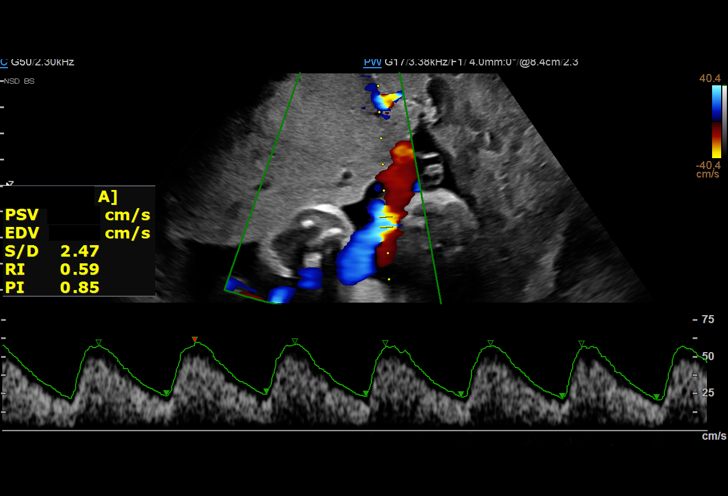
[im 5/12]
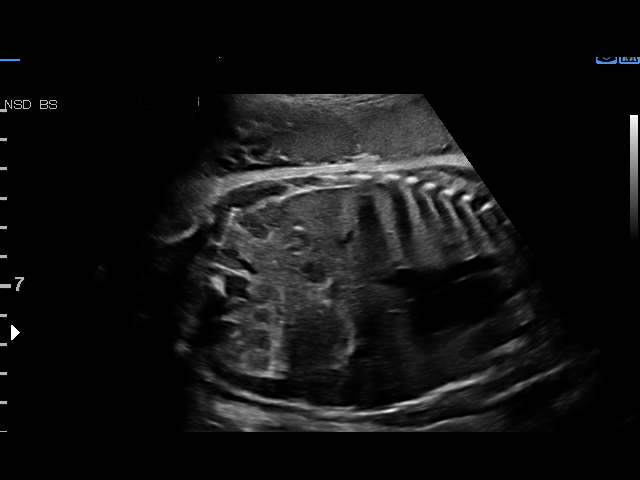
[im 6/12]
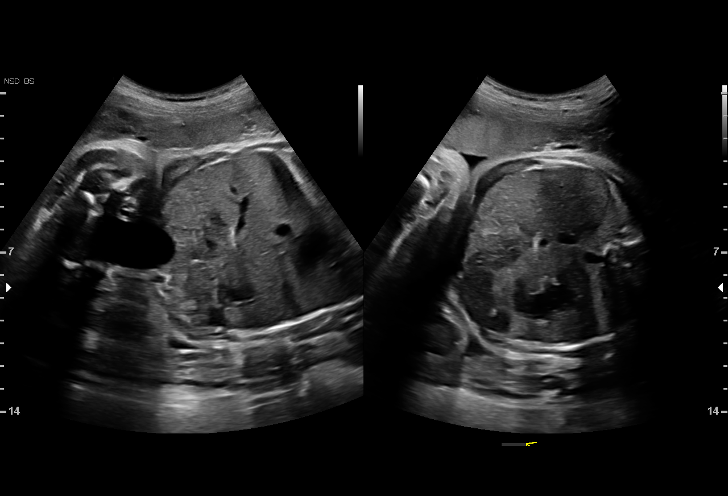
[im 7/12]
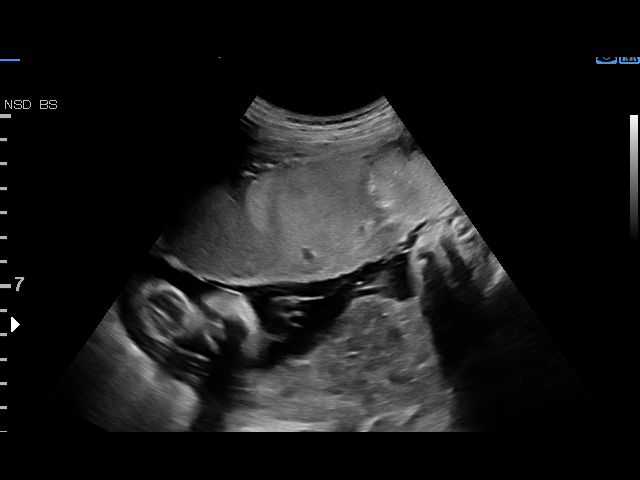
[im 8/12]
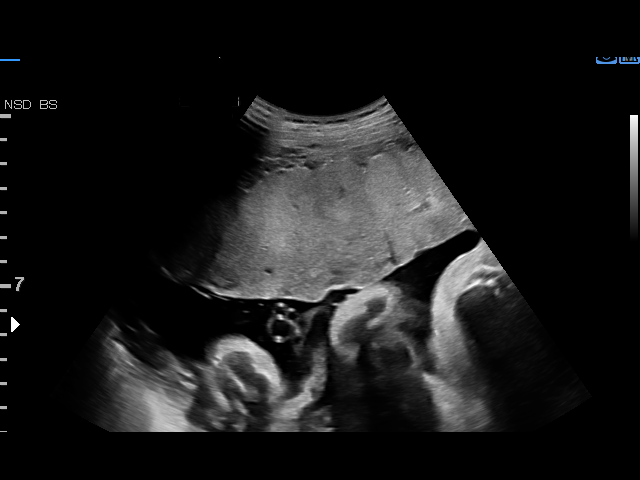
[im 9/12]
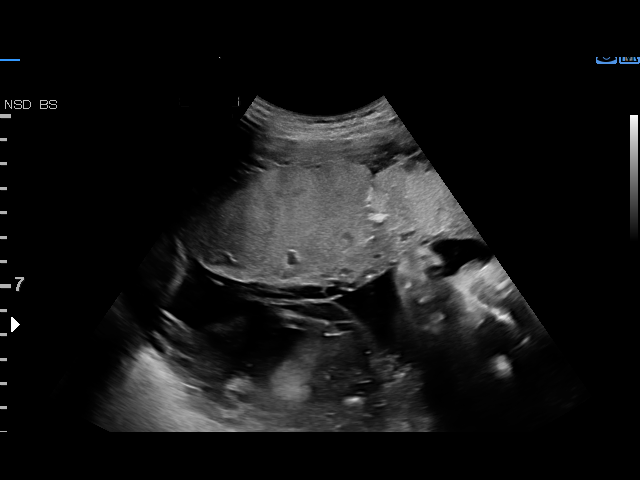
[im 10/12]
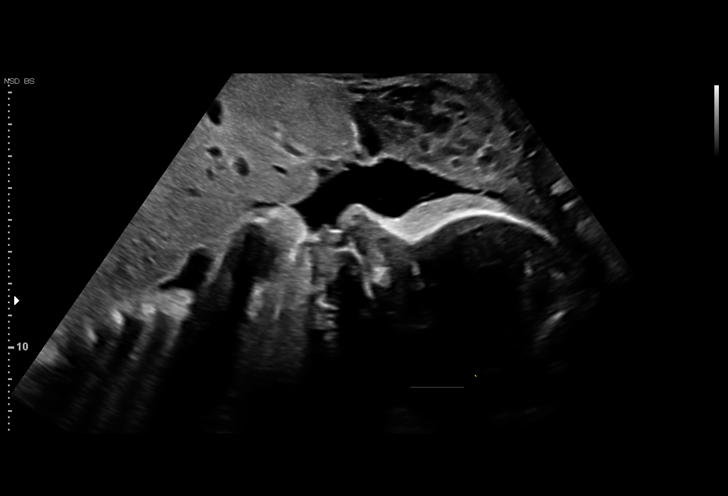
[im 11/12]
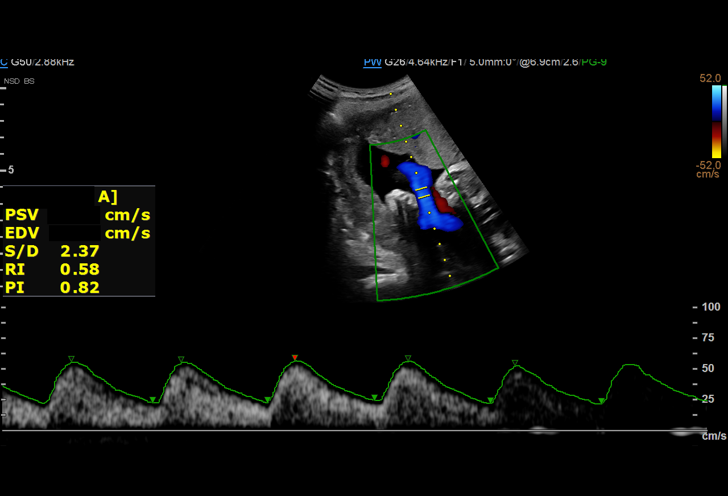
[im 12/12]
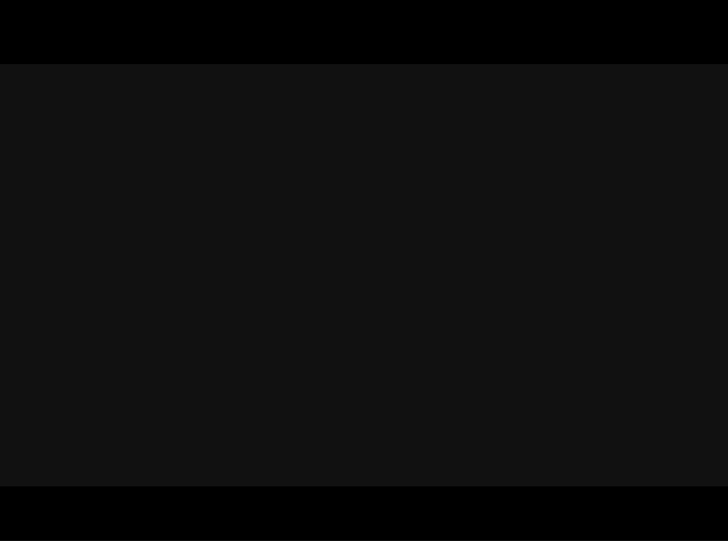

[12 of 12 positions shown; findings below may reference images not displayed]

OB/GYN

Indications

 Maternal care for known or suspected poor
 fetal growth, third trimester, fetus 1 IUGR
 34 weeks gestation of pregnancy
 Small for gestational age fetus affecting
 management of mother
 Teen pregnancy
 Low Risk NIPS(Negative AFP)
 Encounter for other antenatal screening
 follow-up
Fetal Evaluation

 Num Of Fetuses:         1
 Fetal Heart Rate(bpm):  162
 Cardiac Activity:       Observed
 Presentation:           Cephalic

 Amniotic Fluid
 AFI FV:      Within normal limits

 AFI Sum(cm)     %Tile       Largest Pocket(cm)
 17.8            65
 RUQ(cm)       RLQ(cm)       LUQ(cm)        LLQ(cm)
 7.2           2.6           4              4
Biophysical Evaluation

 Amniotic F.V:   Within normal limits       F. Tone:        Observed
 F. Movement:    Observed                   Score:          [DATE]
 F. Breathing:   Observed
OB History

 Gravidity:    1
Gestational Age

 LMP:           34w 1d        Date:  07/02/20                 EDD:   04/08/21
 Best:          34w 1d     Det. By:  LMP  (07/02/20)          EDD:   04/08/21
Doppler - Fetal Vessels

 Umbilical Artery
  S/D     %tile      RI    %tile      PI    %tile            ADFV    RDFV
  2.26       32    0.56       36    0.78       32               No      No

Impression

 Suspected fetal growth restriction.  Patient returned for
 antenatal testing.

 Amniotic fluid is normal and good fetal activity is seen
 .Antenatal testing is reassuring. BPP [DATE].  Umbilical artery
 Doppler showed normal forward diastolic flow.

 Patient reports (I confirmed from her chart) that Dr. Shinn
 mentioned that her EDD is 04/08/2021 by sure LMP that is
 consistent with early ultrasound.  First trimester ultrasound
 was performed at 11 weeks gestation that gave an EDD of
 [DATE] (6 days different from LMP established date).

 I informed the patient that we will be using EDD of
 04/08/2021 and consistent with her obstetrician's assessment.
Recommendations

 -Fetal growth assessment, BPP and UA Doppler next week.
 -Frequency of antenatal testing and timing of delivery after
 fetal growth assessment.
                 Gad, Rekha

## 2023-04-05 ENCOUNTER — Encounter: Payer: Self-pay | Admitting: Family Medicine

## 2023-04-13 ENCOUNTER — Encounter: Payer: Self-pay | Admitting: Pediatrics

## 2023-06-21 DIAGNOSIS — R112 Nausea with vomiting, unspecified: Secondary | ICD-10-CM | POA: Diagnosis not present

## 2023-06-21 DIAGNOSIS — R197 Diarrhea, unspecified: Secondary | ICD-10-CM | POA: Diagnosis not present

## 2023-07-31 ENCOUNTER — Other Ambulatory Visit: Payer: Self-pay | Admitting: Family Medicine

## 2023-07-31 DIAGNOSIS — D539 Nutritional anemia, unspecified: Secondary | ICD-10-CM

## 2023-07-31 DIAGNOSIS — Z131 Encounter for screening for diabetes mellitus: Secondary | ICD-10-CM

## 2023-07-31 DIAGNOSIS — R1013 Epigastric pain: Secondary | ICD-10-CM

## 2023-08-06 ENCOUNTER — Other Ambulatory Visit: Payer: BC Managed Care – PPO

## 2023-08-13 ENCOUNTER — Encounter: Payer: BC Managed Care – PPO | Admitting: Family Medicine

## 2023-10-06 ENCOUNTER — Encounter: Payer: Self-pay | Admitting: Family Medicine

## 2023-10-06 ENCOUNTER — Ambulatory Visit (INDEPENDENT_AMBULATORY_CARE_PROVIDER_SITE_OTHER): Payer: BC Managed Care – PPO | Admitting: Family Medicine

## 2023-10-06 VITALS — BP 110/68 | HR 78 | Temp 98.0°F | Ht 61.5 in | Wt 101.2 lb

## 2023-10-06 DIAGNOSIS — Z131 Encounter for screening for diabetes mellitus: Secondary | ICD-10-CM

## 2023-10-06 DIAGNOSIS — R1013 Epigastric pain: Secondary | ICD-10-CM

## 2023-10-06 DIAGNOSIS — D539 Nutritional anemia, unspecified: Secondary | ICD-10-CM

## 2023-10-06 DIAGNOSIS — Z0000 Encounter for general adult medical examination without abnormal findings: Secondary | ICD-10-CM | POA: Diagnosis not present

## 2023-10-06 DIAGNOSIS — M4126 Other idiopathic scoliosis, lumbar region: Secondary | ICD-10-CM

## 2023-10-06 LAB — BASIC METABOLIC PANEL
BUN: 12 mg/dL (ref 6–23)
CO2: 29 meq/L (ref 19–32)
Calcium: 9.7 mg/dL (ref 8.4–10.5)
Chloride: 105 meq/L (ref 96–112)
Creatinine, Ser: 0.48 mg/dL (ref 0.40–1.20)
GFR: 135.1 mL/min (ref >60.00)
Glucose, Bld: 77 mg/dL (ref 70–99)
Potassium: 4.3 meq/L (ref 3.5–5.1)
Sodium: 139 meq/L (ref 135–145)

## 2023-10-06 LAB — CBC WITH DIFFERENTIAL/PLATELET
Basophils Absolute: 0 10*3/uL (ref 0.0–0.1)
Basophils Relative: 0.7 % (ref 0.0–3.0)
Eosinophils Absolute: 0 10*3/uL (ref 0.0–0.7)
Eosinophils Relative: 0.6 % (ref 0.0–5.0)
HCT: 43 % (ref 36.0–46.0)
Hemoglobin: 14.4 g/dL (ref 12.0–15.0)
Lymphocytes Relative: 33.4 % (ref 12.0–46.0)
Lymphs Abs: 1.9 10*3/uL (ref 0.7–4.0)
MCHC: 33.4 g/dL (ref 30.0–36.0)
MCV: 98 fl (ref 78.0–100.0)
Monocytes Absolute: 0.6 10*3/uL (ref 0.1–1.0)
Monocytes Relative: 10.5 % (ref 3.0–12.0)
Neutro Abs: 3.1 10*3/uL (ref 1.4–7.7)
Neutrophils Relative %: 54.8 % (ref 43.0–77.0)
Platelets: 311 10*3/uL (ref 150.0–400.0)
RBC: 4.39 Mil/uL (ref 3.87–5.11)
RDW: 12.5 % (ref 11.5–15.5)
WBC: 5.6 10*3/uL (ref 4.0–10.5)

## 2023-10-06 LAB — HEPATIC FUNCTION PANEL
ALT: 12 U/L (ref 0–35)
AST: 17 U/L (ref 0–37)
Albumin: 4.5 g/dL (ref 3.5–5.2)
Alkaline Phosphatase: 55 U/L (ref 39–117)
Bilirubin, Direct: 0.1 mg/dL (ref 0.0–0.3)
Total Bilirubin: 0.6 mg/dL (ref 0.2–1.2)
Total Protein: 7.7 g/dL (ref 6.0–8.3)

## 2023-10-06 NOTE — Progress Notes (Unsigned)
 Ph: [phone] Fax: [phone]   Patient ID: Tanya Medina, female    DOB: 19-Jan-2001, 23 y.o.   MRN: [id number]  This visit was conducted in person.  BP 110/68   Pulse 78   Temp 98 F (36.7 C) (Oral)   Ht 5' 1.5" (1.562 m)   Wt 101 lb 4 oz (45.9 kg)   LMP 09/21/2023   SpO2 98%   BMI 18.82 kg/m   Supine pulse 82 Standing pulse 90  CC: CPE Subjective:   HPI: Tanya Medina is a 23 y.o. female presenting on 10/06/2023 for Annual Exam   Daughter diagnosed with autism.   Notes GI symptoms of stomach bloating, pain, diarrhea, nausea predominantly after eating gluten (bread, pasta, cereal ,pizza).  Fmhx celiac disease - brother and maternal aunt. Requests rpt celiac disease testing.  Previously tested negative for celiac disease 05/2022 (negative Gliadin IgG and IgA, negative anti-TTG IgG and IgA, elevated IgA (454).   Dizziness 1d prior to onset of periods, occasional dizziness otherwise.  LMP 09/21/2023 - some irregularly to cycles. Uses 4 pads/day for 1-2 days per cycle.  Cousin with POTS - she wonders about this.   Nexplanon removed 06/2022. Feels better since it was removed.   H/o mild scoliosis - last checked 2021. Notes back pain to left side - worse with rainy or cold weather.   Preventative: Colon cancer screening - not due Lung cancer screening - not due Breast cancer screening - not due Well woman exam - sees OBGYN Dr Cherly Hensen, last seen 2022.  DEXA scan - not due Flu shot - declined COVID shot - Pfizer 11/2019, 12/2019, booster 07/2020 Tdap 01/2021 Pneumonia shot - not due Shingrix - not due Advanced directive discussion - not discussed Seat belt use discussed Sunscreen use discussed. No changing moles on skin.  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 Interim medical history since our last visit reviewed. Allergies and medications reviewed and updated. No outpatient medications prior to visit.   No facility-administered medications prior to visit.     Per HPI unless specifically indicated in ROS section below Review of Systems  Constitutional:  Negative for activity change, appetite change, chills, fatigue, fever and unexpected weight change.  HENT:  Negative for hearing loss.   Eyes:  Negative for visual disturbance.  Respiratory:  Negative for cough, chest tightness, shortness of breath and wheezing.   Cardiovascular:  Negative for chest pain, palpitations and leg swelling.  Gastrointestinal:  Positive for abdominal pain, diarrhea (occ - gluten related) and nausea (occ - gluten related). Negative for abdominal distention, blood in stool, constipation and vomiting.  Genitourinary:  Negative for difficulty urinating and hematuria.  Musculoskeletal:  Negative for arthralgias, myalgias and neck pain.  Skin:  Negative for rash.  Neurological:  Positive for dizziness (at onset of menses) and headaches. Negative for seizures and syncope.  Hematological:  Negative for adenopathy. Does not bruise/bleed easily.  Psychiatric/Behavioral:  Negative for dysphoric mood. The patient is nervous/anxious.     Objective:  BP 110/68   Pulse 78   Temp 98 F (36.7 C) (Oral)   Ht 5' 1.5" (1.562 m)   Wt  101 lb 4 oz (45.9 kg)   LMP 09/21/2023   SpO2 98%   BMI 18.82 kg/m   Wt Readings from Last 3 Encounters:  10/06/23 101 lb 4 oz (45.9 kg)  08/05/22 102 lb (46.3 kg)  05/22/22 104 lb 2 oz (47.2 kg)      Physical Exam Vitals and nursing note reviewed.  Constitutional:      Appearance: Normal appearance. She is not ill-appearing.  HENT:     Head: Normocephalic and atraumatic.     Right Ear: Tympanic membrane, ear  canal and external ear normal. There is no impacted cerumen.     Left Ear: Tympanic membrane, ear canal and external ear normal. There is no impacted cerumen.     Mouth/Throat:     Mouth: Mucous membranes are moist.     Pharynx: Oropharynx is clear. No oropharyngeal exudate or posterior oropharyngeal erythema.  Eyes:     General:        Right eye: No discharge.        Left eye: No discharge.     Extraocular Movements: Extraocular movements intact.     Conjunctiva/sclera: Conjunctivae normal.     Pupils: Pupils are equal, round, and reactive to light.  Neck:     Thyroid: No thyroid mass or thyromegaly.  Cardiovascular:     Rate and Rhythm: Normal rate and regular rhythm.     Pulses: Normal pulses.     Heart sounds: Normal heart sounds. No murmur heard. Pulmonary:     Effort: Pulmonary effort is normal. No respiratory distress.     Breath sounds: Normal breath sounds. No wheezing, rhonchi or rales.  Abdominal:     General: Bowel sounds are normal. There is no distension.     Palpations: Abdomen is soft. There is no mass.     Tenderness: There is no abdominal tenderness. There is no guarding or rebound.     Hernia: No hernia is present.  Musculoskeletal:     Cervical back: Normal range of motion and neck supple. No rigidity.     Right lower leg: No edema.     Left lower leg: No edema.     Comments:  Mild scoliosis present to lumbar and thoracic spine No significant mildline spine tenderness Tender to L lower back palpation Some tightness to R upper lumbar paraspinous mm   Lymphadenopathy:     Cervical: No cervical adenopathy.  Skin:    General: Skin is warm and dry.     Findings: No rash.  Neurological:     General: No focal deficit present.     Mental Status: She is alert. Mental status is at baseline.  Psychiatric:        Mood and Affect: Mood normal.        Behavior: Behavior normal.       Results for orders placed or performed in visit on 07/29/22  CBC with  Differential/Platelet   Collection Time: 07/29/22  7:56 AM  Result Value Ref Range   WBC 6.3 4.5 - 10.5 K/uL   RBC 4.32 3.87 - 5.11 Mil/uL   Hemoglobin 13.9 12.0 - 15.0 g/dL   HCT 16.1 09.6 - 04.5 %   MCV 95.6 78.0 - 100.0 fl   MCHC 33.7 30.0 - 36.0 g/dL   RDW 40.9 81.1 - 91.4 %   Platelets 294.0 150.0 - 400.0 K/uL   Neutrophils Relative % 44.7 43.0 - 77.0 %   Lymphocytes Relative 44.6 12.0 - 46.0 %  Monocytes Relative 9.0 3.0 - 12.0 %   Eosinophils Relative 1.5 0.0 - 5.0 %   Basophils Relative 0.2 0.0 - 3.0 %   Neutro Abs 2.8 1.4 - 7.7 K/uL   Lymphs Abs 2.8 0.7 - 4.0 K/uL   Monocytes Absolute 0.6 0.1 - 1.0 K/uL   Eosinophils Absolute 0.1 0.0 - 0.7 K/uL   Basophils Absolute 0.0 0.0 - 0.1 K/uL  Basic metabolic panel   Collection Time: 07/29/22  7:56 AM  Result Value Ref Range   Sodium 140 135 - 145 mEq/L   Potassium 4.0 3.5 - 5.1 mEq/L   Chloride 106 96 - 112 mEq/L   CO2 27 19 - 32 mEq/L   Glucose, Bld 81 70 - 99 mg/dL   BUN 12 6 - 23 mg/dL   Creatinine, Ser 8.29 0.40 - 1.20 mg/dL   GFR 562.13 >08.65 mL/min   Calcium 9.2 8.4 - 10.5 mg/dL  Lipid panel   Collection Time: 07/29/22  7:56 AM  Result Value Ref Range   Cholesterol 136 0 - 200 mg/dL   Triglycerides 78.4 0.0 - 149.0 mg/dL   HDL 69.62 >95.28 mg/dL   VLDL 41.3 0.0 - 24.4 mg/dL   LDL Cholesterol 72 0 - 99 mg/dL   Total CHOL/HDL Ratio 3    NonHDL 89.54   TSH   Collection Time: 07/29/22  7:56 AM  Result Value Ref Range   TSH 1.95 0.35 - 5.50 uIU/mL  Hepatitis C antibody   Collection Time: 07/29/22  7:56 AM  Result Value Ref Range   Hepatitis C Ab NON-REACTIVE NON-REACTIVE    Assessment & Plan:   Problem List Items Addressed This Visit     Health maintenance examination - Primary (Chronic)   Preventative protocols reviewed and updated unless pt declined. Discussed healthy diet and lifestyle.       Macrocytic anemia   Epigastric pain   Relevant Orders   Celiac Pnl 2 rflx Endomysial Ab Ttr   Other  Visit Diagnoses       Diabetes mellitus screening            No orders of the defined types were placed in this encounter.   Orders Placed This Encounter  Procedures   Celiac Pnl 2 rflx Endomysial Ab Ttr    Patient Instructions  Laboratorios hoy.  Trate ejercicios para espalda que [employer]. Si no mejora, dejeme saber para ver terapista fisico.  Gusto verla hoy  Regresar en 1 ao para proximo examen fisico.   Follow up plan: Return in about 1 year (around 10/05/2024) for annual exam, prior fasting for blood work.  Eustaquio Boyden, MD

## 2023-10-06 NOTE — Assessment & Plan Note (Signed)
 Preventative protocols reviewed and updated unless pt declined. Discussed healthy diet and lifestyle.

## 2023-10-06 NOTE — Patient Instructions (Addendum)
 Laboratorios hoy.  Trate ejercicios para espalda que [employer]. Si no mejora, dejeme saber para ver terapista fisico.  Gusto verla hoy  Regresar en 1 ao para proximo examen fisico.

## 2023-10-07 ENCOUNTER — Encounter: Payer: Self-pay | Admitting: Family Medicine

## 2023-10-07 ENCOUNTER — Ambulatory Visit (INDEPENDENT_AMBULATORY_CARE_PROVIDER_SITE_OTHER)

## 2023-10-07 ENCOUNTER — Other Ambulatory Visit: Payer: Self-pay | Admitting: Family Medicine

## 2023-10-07 DIAGNOSIS — M4126 Other idiopathic scoliosis, lumbar region: Secondary | ICD-10-CM | POA: Diagnosis not present

## 2023-10-07 DIAGNOSIS — E559 Vitamin D deficiency, unspecified: Secondary | ICD-10-CM | POA: Insufficient documentation

## 2023-10-07 LAB — VITAMIN D 25 HYDROXY (VIT D DEFICIENCY, FRACTURES): VITD: 16.72 ng/mL — ABNORMAL LOW (ref 30.00–100.00)

## 2023-10-07 MED ORDER — VITAMIN D3 25 MCG (1000 UT) PO CAPS: 1.0000 | ORAL_CAPSULE | Freq: Every day | ORAL | Status: AC

## 2023-10-07 NOTE — Addendum Note (Signed)
 Addended by: Lovena Neighbours on: 10/07/2023 10:47 AM   Modules accepted: Orders

## 2023-10-07 NOTE — Assessment & Plan Note (Addendum)
 Update celiac testing with symptoms noted with gluten exposure, and strong family history. Discussed likely will recommend avoiding gluten as at least has gluten sensitivity/intolerance even if not true celiac sprue disease.

## 2023-10-07 NOTE — Assessment & Plan Note (Addendum)
 H/o scoliosis, notes ongoing intermittent left sided low back pain.  Provided generic low back exercises from [facility] pt advisor and will refer to physical therapy.  Only 14 degrees of dextroscoliosis from T2 to L2 on scoliosis films 02/2020.  Noted 2.5 inch height loss in 1 year - will update scoliosis films.  Recommend regular weight bearing exercise, good calcium and vit D intake, consider modified pilates exercise. Consider light weight pull-string corset-like lumbar brace.

## 2023-10-07 NOTE — Addendum Note (Signed)
 Addended by: Eustaquio Boyden on: 10/07/2023 11:47 AM   Modules accepted: Orders

## 2023-10-07 NOTE — Assessment & Plan Note (Addendum)
H/o this. Update CBC.

## 2023-10-15 LAB — CELIAC PNL 2 RFLX ENDOMYSIAL AB TTR
(tTG) Ab, IgA: 1.6 U/mL
(tTG) Ab, IgG: <1 U/mL
Endomysial Ab IgA: NEGATIVE
Gliadin IgA: 4 U/mL
Gliadin IgG: 1.3 U/mL
Immunoglobulin A: 433 mg/dL — ABNORMAL HIGH (ref 47–310)

## 2023-10-16 ENCOUNTER — Encounter: Payer: Self-pay | Admitting: Family Medicine

## 2023-10-17 ENCOUNTER — Encounter: Payer: Self-pay | Admitting: Family Medicine

## 2023-10-17 DIAGNOSIS — R1013 Epigastric pain: Secondary | ICD-10-CM

## 2023-10-20 ENCOUNTER — Ambulatory Visit: Attending: Family Medicine | Admitting: Physical Therapy

## 2023-10-20 ENCOUNTER — Other Ambulatory Visit: Payer: Self-pay

## 2023-10-20 ENCOUNTER — Encounter: Payer: Self-pay | Admitting: Physical Therapy

## 2023-10-20 DIAGNOSIS — M5459 Other low back pain: Secondary | ICD-10-CM | POA: Diagnosis not present

## 2023-10-20 DIAGNOSIS — M4126 Other idiopathic scoliosis, lumbar region: Secondary | ICD-10-CM | POA: Insufficient documentation

## 2023-10-20 NOTE — Therapy (Signed)
 OUTPATIENT PHYSICAL THERAPY THORACOLUMBAR EVALUATION   Patient Name: Tanya Medina MRN: [id number] DOB:Feb 08, 2001, 23 y.o., female Today's Date: 10/20/2023  END OF SESSION:  PT End of Session - 10/20/23 1656     Visit Number 1    Number of Visits 1    PT Start Time 1658    PT Stop Time 1738    PT Time Calculation (min) 40 min             Past Medical History:  Diagnosis Date   Hypoglycemia    Idiopathic scoliosis of lumbar region 2010   mild    Past Surgical History:  Procedure Laterality Date   NO PAST SURGERIES     Patient Active Problem List   Diagnosis Date Noted   Vitamin D deficiency 10/07/2023   Epigastric pain 05/24/2022   Paresthesia of right leg 07/14/2021   Macrocytic anemia 07/14/2021   Depression with anxiety 01/10/2021   Anxiety 11/14/2020   Health maintenance examination 01/04/2020   Hair loss 01/04/2020   Idiopathic scoliosis of lumbar region 2010    PCP: Eustaquio Boyden, MD  REFERRING PROVIDER: Eustaquio Boyden, MD  REFERRING DIAG: Idiopathic scoliosis of lumbar region [M41.26]   Rationale for Evaluation and Treatment: Rehabilitation  THERAPY DIAG:  Other low back pain  PERTINENT HISTORY: Lumbar scoliosis  WEIGHT BEARING RESTRICTIONS: No  FALLS:  Has patient fallen in last 6 months? No  LIVING ENVIRONMENT: Lives with: lives with their family Lives in: House/apartment Stairs: Yes: External: 20 steps; on left going up Has following equipment at home: None  OCCUPATION: not currently working   PRECAUTIONS: None ---------------------------------------------------------------------------------------------  SUBJECTIVE:                                                                                                                                                                                           SUBJECTIVE STATEMENT: Eval statement 10/20/2023: has had scoliosis since she was 9, ever since the epidural during  pregnancy has had low back pain and pain directly under the tail bone. No N/T.  Currently 0/10, gets up to a 4/10. Doesn't want to attend therapy, just wants exercises to do at home to treat pain.  RED FLAGS: Bowel or bladder incontinence: No and Cauda equina syndrome: No    PLOF: Independent  PATIENT GOALS: figure out what I can do at home  NEXT MD VISIT: next year ---------------------------------------------------------------------------------------------  OBJECTIVE:  Note: Objective measures were completed at Evaluation unless otherwise noted.  PATIENT SURVEYS:  Modified Oswestry 3/50 (6%)   COGNITION: Overall cognitive status: Within functional limits for tasks assessed   PALPATION: Tenderness at the piriformis  Lumbar  contraction pattern  L Multifidus:high quality contraction  R Multifidus:high quality contraction   SENSATION: WFL  MUSCLE LENGTH: Hamstrings: Right 20; deg; Left 20 deg   POSTURE: No Significant postural limitations   LUMBAR ROM:   AROM eval  Flexion 70%  Extension 100  Right lateral flexion 80%  Left lateral flexion 80%  Right rotation 80%  Left rotation 80%   (Blank rows = not tested)  ! Indicates pain with testing  LOWER EXTREMITY ROM:     Passive  Right eval Left eval  Hip flexion    Hip extension    Hip abduction    Hip adduction    Hip internal rotation    Hip external rotation    Knee flexion    Knee extension    Ankle dorsiflexion    Ankle plantarflexion    Ankle inversion    Ankle eversion     (Blank rows = not tested)  ! Indicates pain with testing  LOWER EXTREMITY MMT:    MMT Right eval Left eval  Hip flexion 4 4  Hip extension 5 5  Hip abduction 4 4  Hip adduction    Hip internal rotation    Hip external rotation    Knee flexion 5 5  Knee extension 5 5  Ankle dorsiflexion    Ankle plantarflexion    Ankle inversion    Ankle eversion     (Blank rows = not tested)   ! Indicates pain with  testing LUMBAR SPECIAL TESTS:  Prone instability test: Negative [facility] Adult PT Treatment:                                                DATE: 10/20/2023 Manual therapy   - Piriformis release Self Care: POC discussion Pt education                                                                                                                                PATIENT EDUCATION:  Education details: Pt received education regarding HEP performance, ADL performance, functional activity tolerance, impairment education, appropriate performance of therapeutic activities.  Person educated: Patient and Spouse Education method: Explanation, Demonstration, Tactile cues, Verbal cues, and Handouts Education comprehension: verbalized understanding and returned demonstration  HOME EXERCISE PROGRAM: Access Code: [email or web address] URL: [email or web address] Date: 10/20/2023 Prepared by: Sheliah Plane  Exercises - Piriformis Mobilization with Small Ball  - 1 x daily - 7 x weekly - 1-2 sets - 1 reps - 73m hold - Supine Piriformis Stretch with Foot on Ground  - 1 x daily - 7 x weekly - 2 sets - 1 reps - 52m hold - Supine Figure 4 Piriformis Stretch  - 1 x daily - 7 x weekly - 2 sets - 1 reps - 69m hold - Clamshell with Resistance  -  1 x daily - 7 x weekly - 2-3 sets - 12 reps - 3s hold - Supine Bridge with Resistance Band  - 1 x daily - 7 x weekly - 2-3 sets - 12 reps - 4s hold ---------------------------------------------------------------------------------------------  ASSESSMENT:  CLINICAL IMPRESSION: Eval impression (10/20/2023): Pt. attended today's physical therapy session for evaluation of low back pain. Pt has complaints of 4/10 pain with standing, walking, and stairs. Pt has notable deficits with global hip strength and resting tone of piriformis.  Pt would benefit from therapeutic focus on piriformis motility, global hip strengthening.  Treatment performed today focused on pt education detailed  in obj. Pt demonstrated great understanding of education provided. Required minimal verbal/tactile cues and no physical assistance for appropriate performance with today's activities. Pt would benefit from the intervention of skilled outpatient physical therapy to address the aforementioned deficits and progress towards a functional level in line with therapeutic goals. However, requested for only the evaluation and a HEP, does not want to attend therapy at current time.    OBJECTIVE IMPAIRMENTS: decreased mobility, decreased strength, impaired tone, improper body mechanics, and pain.   ACTIVITY LIMITATIONS: standing, stairs, and locomotion level  PARTICIPATION LIMITATIONS: community activity and occupation  PERSONAL FACTORS: 1 comorbidity: scoliosis  are also affecting patient's functional outcome.   REHAB POTENTIAL: Excellent  CLINICAL DECISION MAKING: Stable/uncomplicated  EVALUATION COMPLEXITY: Low   GOALS: Goals reviewed with patient? YES  SHORT TERM GOALS: Target date: 10/20/2023   Pt will be independent with administered HEP to demonstrate the competency necessary for long term managemnet of symptoms at home. Baseline: Goal status: MET 10/20/2023  ---------------------------------------------------------------------------------------------  PLAN:  PT FREQUENCY: one time visit  PT DURATION: other: one time visit  PLANNED INTERVENTIONS: one time visit  PLAN FOR NEXT SESSION: one time visit   Sheliah Plane, PT, DPT 10/20/2023, 5:42 PM

## 2023-11-30 ENCOUNTER — Encounter: Payer: Self-pay | Admitting: Gastroenterology

## 2023-12-06 DIAGNOSIS — N915 Oligomenorrhea, unspecified: Secondary | ICD-10-CM | POA: Diagnosis not present

## 2023-12-06 DIAGNOSIS — Z01419 Encounter for gynecological examination (general) (routine) without abnormal findings: Secondary | ICD-10-CM | POA: Diagnosis not present

## 2023-12-06 DIAGNOSIS — L659 Nonscarring hair loss, unspecified: Secondary | ICD-10-CM | POA: Diagnosis not present

## 2023-12-06 DIAGNOSIS — N643 Galactorrhea not associated with childbirth: Secondary | ICD-10-CM | POA: Diagnosis not present

## 2024-01-25 ENCOUNTER — Ambulatory Visit: Admitting: Gastroenterology

## 2024-01-25 ENCOUNTER — Encounter: Payer: Self-pay | Admitting: Gastroenterology

## 2024-01-25 ENCOUNTER — Other Ambulatory Visit (INDEPENDENT_AMBULATORY_CARE_PROVIDER_SITE_OTHER)

## 2024-01-25 ENCOUNTER — Ambulatory Visit: Payer: Self-pay | Admitting: Gastroenterology

## 2024-01-25 VITALS — BP 110/62 | HR 80 | Ht 61.0 in | Wt 102.0 lb

## 2024-01-25 DIAGNOSIS — Z8379 Family history of other diseases of the digestive system: Secondary | ICD-10-CM | POA: Diagnosis not present

## 2024-01-25 DIAGNOSIS — R1013 Epigastric pain: Secondary | ICD-10-CM | POA: Diagnosis not present

## 2024-01-25 DIAGNOSIS — R768 Other specified abnormal immunological findings in serum: Secondary | ICD-10-CM

## 2024-01-25 DIAGNOSIS — E559 Vitamin D deficiency, unspecified: Secondary | ICD-10-CM

## 2024-01-25 DIAGNOSIS — R194 Change in bowel habit: Secondary | ICD-10-CM

## 2024-01-25 DIAGNOSIS — R112 Nausea with vomiting, unspecified: Secondary | ICD-10-CM | POA: Diagnosis not present

## 2024-01-25 DIAGNOSIS — R140 Abdominal distension (gaseous): Secondary | ICD-10-CM | POA: Diagnosis not present

## 2024-01-25 LAB — TSH: TSH: 1.93 u[IU]/mL (ref 0.35–5.50)

## 2024-01-25 LAB — C-REACTIVE PROTEIN: CRP: <1 mg/dL (ref 0.5–20.0)

## 2024-01-25 NOTE — Patient Instructions (Signed)
 You have been scheduled for an abdominal ultrasound at [facility] Radiology (1st floor of hospital) on 02/03/24 at 9:45. Please arrive 30 minutes prior to your appointment for registration. Make certain not to have anything to eat or drink after midnight prior to your appointment. Should you need to reschedule your appointment, please contact radiology at [phone]. This test typically takes about 30 minutes to perform.  Your provider has requested that you go to the basement level for lab work before leaving today. Press B on the elevator. The lab is located at the first door on the left as you exit the elevator.  You have been scheduled for an endoscopy. Please follow written instructions given to you at your visit today.  If you use inhalers (even only as needed), please bring them with you on the day of your procedure.  If you take any of the following medications, they will need to be adjusted prior to your procedure:   DO NOT TAKE 7 DAYS PRIOR TO TEST- Trulicity (dulaglutide) Ozempic, Wegovy (semaglutide) Mounjaro (tirzepatide) Bydureon Bcise (exanatide extended release)  DO NOT TAKE 1 DAY PRIOR TO YOUR TEST Rybelsus (semaglutide) Adlyxin (lixisenatide) Victoza (liraglutide) Byetta (exanatide) ___________________________________________________________________________  Due to recent changes in healthcare laws, you may see the results of your imaging and laboratory studies on MyChart before your provider has had a chance to review them.  We understand that in some cases there may be results that are confusing or concerning to you. Not all laboratory results come back in the same time frame and the provider may be waiting for multiple results in order to interpret others.  Please give us  48 hours in order for your provider to thoroughly review all the results before contacting the office for clarification of your results.    _______________________________________________________  If your blood pressure at your visit was 140/90 or greater, please contact your primary care physician to follow up on this.  _______________________________________________________  If you are age 14 or older, your body mass index should be between 23-30. Your Body mass index is 19.27 kg/m. If this is out of the aforementioned range listed, please consider follow up with your Primary Care Provider.  If you are age 106 or younger, your body mass index should be between 19-25. Your Body mass index is 19.27 kg/m. If this is out of the aformentioned range listed, please consider follow up with your Primary Care Provider.   ________________________________________________________  The  GI providers would like to encourage you to use MYCHART to communicate with providers for non-urgent requests or questions.  Due to long hold times on the telephone, sending your provider a message by [facility] may be a faster and more efficient way to get a response.  Please allow 48 business hours for a response.  Please remember that this is for non-urgent requests.  _______________________________________________________  Thank you for trusting me with your gastrointestinal care. Deanna May, RNP

## 2024-01-25 NOTE — Progress Notes (Signed)
 Chief Complaint:epigastric pain,nausea, bloating, diarrhea Primary GI Doctor: Dr. Legrand  HPI:  Patient is a  23  year old female patient with past medical history of vitamin D  deficiency, who was referred to me by Rilla Baller, MD on 11/08/2023 for a complaint of epigastric pain,nausea,bloating,diarrhea .    Interval History    Patient presents for evaluation of epigastric pain, bloating,nausea,  and diarrhea. Accompanied by her mom who is a patient of Dr. Legrand.  Patient reports she has had the above symptoms for over a year.  Patient notes her symptoms are worse when she eats products that contain gluten.  If she avoids most gluten she states her symptoms significantly improved.  She is still consuming gluten on a regular basis.  She does not eat any fatty greasy foods or spicy foods.  Patient describes the pain she gets in the upper abdomen as feeling like she has a knot .  Patient states she will have a daily bowel movement but with the episodes of diarrhea she can have up to 3 loose stools.  No urgency or nocturnal symptoms. No blood in stool.  She reports she will has nausea with the symptoms but no vomiting.  Her weight tends to stay between 100 and 105 pounds.  Patient states her appetite is good.  She uses over-the-counter Pepto which seems to help the most.  Patient denies symptoms of reflux or dysphagia.  Patient uses OTC Advil  prn, very seldom.   No alcohol use. Nonsmoker.   Patient's family history includes mother, brother, and aunt all have celiac disease. Brother and Aunt with fatty liver.  Wt Readings from Last 3 Encounters:  01/25/24 102 lb (46.3 kg)  10/06/23 101 lb 4 oz (45.9 kg)  08/05/22 102 lb (46.3 kg)    Past Medical History:  Diagnosis Date   Anemia affecting pregnancy    Hypoglycemia    Idiopathic scoliosis of lumbar region 2010   mild     Past Surgical History:  Procedure Laterality Date   NO PAST SURGERIES      Current Outpatient Medications   Medication Sig Dispense Refill   Cholecalciferol (VITAMIN D3) 25 MCG (1000 UT) CAPS Take 1 capsule (1,000 Units total) by mouth daily.     No current facility-administered medications for this visit.    Allergies as of 01/25/2024   (No Known Allergies)    Family History  Problem Relation Age of Onset   Asthma Mother    Miscarriages / [place] Mother    Depression Father    Asthma Brother    Celiac disease Brother    Diabetes Maternal Grandmother    Alcohol abuse Maternal Grandfather    Cancer Maternal Grandfather        throat   Arthritis Paternal Grandmother    Alcohol abuse Paternal Grandfather    Cancer Paternal Grandfather    Autism spectrum disorder Daughter    Celiac disease Maternal Aunt    Liver disease Neg Hx    Esophageal cancer Neg Hx    Colon cancer Neg Hx     Review of Systems:    Constitutional: No weight loss, fever, chills, weakness or fatigue HEENT: Eyes: No change in vision               Ears, Nose, Throat:  No change in hearing or congestion Skin: No rash or itching Cardiovascular: No chest pain, chest pressure or palpitations   Respiratory: No SOB or cough Gastrointestinal: See HPI and otherwise negative  Genitourinary: No dysuria or change in urinary frequency Neurological: No headache, dizziness or syncope Musculoskeletal: No new muscle or joint pain Hematologic: No bleeding or bruising Psychiatric: No history of depression or anxiety    Physical Exam:  Vital signs: BP 110/62   Pulse 80   Ht 5' 1 (1.549 m)   Wt 102 lb (46.3 kg)   BMI 19.27 kg/m   Constitutional:   Pleasant  female appears to be in NAD, Well developed, Well nourished, alert and cooperative Throat: Oral cavity and pharynx without inflammation, swelling or lesion.  Respiratory: Respirations even and unlabored. Lungs clear to auscultation bilaterally.   No wheezes, crackles, or rhonchi.  Cardiovascular: Normal S1, S2. Regular rate and rhythm. No peripheral edema,  cyanosis or pallor.  Gastrointestinal:  Soft, nondistended, upper abd tenderness with palpation. No rebound or guarding. Normal bowel sounds. No appreciable masses or hepatomegaly. Rectal:  Not performed.  Msk:  Symmetrical without gross deformities. Without edema, no deformity or joint abnormality.  Neurologic:  Alert and  oriented x4;  grossly normal neurologically.  Skin:   Dry and intact without significant lesions or rashes. Psychiatric: Oriented to person, place and time. Demonstrates good judgement and reason without abnormal affect or behaviors.  RELEVANT LABS AND IMAGING: CBC    Latest Ref Rng & Units 10/06/2023   10:04 AM 07/29/2022    7:56 AM 05/21/2022    9:49 PM  CBC  WBC 4.0 - 10.5 K/uL 5.6  6.3  8.9   Hemoglobin 12.0 - 15.0 g/dL 85.5  86.0  85.5   Hematocrit 36.0 - 46.0 % 43.0  41.3  43.0   Platelets 150.0 - 400.0 K/uL 311.0  294.0  317      CMP     Latest Ref Rng & Units 10/06/2023   10:04 AM 07/29/2022    7:56 AM 05/22/2022   11:55 AM  CMP  Glucose 70 - 99 mg/dL 77  81    BUN 6 - 23 mg/dL 12  12    Creatinine 9.59 - 1.20 mg/dL 9.51  9.49    Sodium 864 - 145 mEq/L 139  140    Potassium 3.5 - 5.1 mEq/L 4.3  4.0    Chloride 96 - 112 mEq/L 105  106    CO2 19 - 32 mEq/L 29  27    Calcium 8.4 - 10.5 mg/dL 9.7  9.2    Total Protein 6.0 - 8.3 g/dL 7.7   7.8   Total Bilirubin 0.2 - 1.2 mg/dL 0.6   1.3   Alkaline Phos 39 - 117 U/L 55   65   AST 0 - 37 U/L 17   16   ALT 0 - 35 U/L 12   12      Lab Results  Component Value Date   TSH 1.95 07/29/2022   10/17/23- celiac labs gliadin IgG 1.3, Gliadin IgA 4, TTG AB IgG <1.0, ttg Ab IgA 1.6 , endomysial Ab IgA negative , immunoglobulin A 433 (elevated) , vitamin D  16.72  Assessment: Encounter Diagnoses  Name Primary?   Abdominal bloating Yes   Altered bowel habits    Nausea and vomiting, unspecified vomiting type    Abdominal pain, epigastric    Family history of celiac disease    Elevated immunoglobulin A       23 year old female patient with family history of celiac disease in her mother and her brother who presents with worsening symptoms of abdominal bloating, diarrhea, nausea, and epigastric pain.  Patient  symptoms are worse when she consumes items that contain gluten and if she avoids those items her symptoms improve.  Patient has continued to consume gluten products. Her PCP has done celiac testing that only showed elevated immunoglobulin A 433 and vitamin D  deficiency 16.72. Normal LFT's. She also had elevated Immunoglobulin A in Oct 2023. She would like to pursue further workup due to the symptoms persisting for over a year.  I will check lab work for inflammatory marker to rule out inflammatory disease and thyroid  level to rule out thyroid  disease.  I will also order right upper quadrant ultrasound to rule out acute cause.  Recommended patient avoid NSAIDs due to current symptoms.  Will go ahead and proceed with upper GI endoscopy in [facility] with Dr. Legrand with small bowel biopsy to rule out gastritis, peptic ulcer disease, and/or celiac disease.  Plan: -Check CRP, TSH today  -Order Abd US  RUQ -No NSAIDs  - Schedule EGD in [facility] with Dr. Legrand. The risks and benefits of EGD with possible biopsies and esophageal dilation were discussed with the patient who agrees to proceed.  Thank you for the courtesy of this consult. Please call me with any questions or concerns.   Quincie Haroon, FNP-C [facility] 01/25/2024, 4:29 PM  Cc: Rilla Baller, MD

## 2024-01-27 NOTE — Progress Notes (Signed)
 ____________________________________________________________  Attending physician addendum:  Thank you for sending this case to me. I have reviewed the entire note and agree with the plan.  Possibly not celiac gluten sensitive +/-  IBS. Will biopsy the duodenum during EGD.  Victory Brand, MD  ____________________________________________________________

## 2024-02-03 ENCOUNTER — Ambulatory Visit (HOSPITAL_COMMUNITY)
Admission: RE | Admit: 2024-02-03 | Discharge: 2024-02-03 | Disposition: A | Discharge status: Home / Self Care | Source: Ambulatory Visit | Attending: Gastroenterology | Admitting: Gastroenterology

## 2024-02-03 DIAGNOSIS — Z8379 Family history of other diseases of the digestive system: Secondary | ICD-10-CM | POA: Diagnosis not present

## 2024-02-03 DIAGNOSIS — R194 Change in bowel habit: Secondary | ICD-10-CM | POA: Insufficient documentation

## 2024-02-03 DIAGNOSIS — R140 Abdominal distension (gaseous): Secondary | ICD-10-CM | POA: Insufficient documentation

## 2024-02-03 DIAGNOSIS — R112 Nausea with vomiting, unspecified: Secondary | ICD-10-CM | POA: Insufficient documentation

## 2024-02-03 DIAGNOSIS — R1013 Epigastric pain: Secondary | ICD-10-CM | POA: Insufficient documentation

## 2024-02-25 ENCOUNTER — Encounter: Payer: Self-pay | Admitting: Gastroenterology

## 2024-03-03 ENCOUNTER — Ambulatory Visit: Admitting: Gastroenterology

## 2024-03-03 ENCOUNTER — Encounter: Payer: Self-pay | Admitting: Gastroenterology

## 2024-03-03 VITALS — BP 109/71 | HR 74 | Temp 98.0°F | Resp 11 | Ht 61.0 in | Wt 102.0 lb

## 2024-03-03 DIAGNOSIS — R1013 Epigastric pain: Secondary | ICD-10-CM

## 2024-03-03 DIAGNOSIS — Z8379 Family history of other diseases of the digestive system: Secondary | ICD-10-CM

## 2024-03-03 DIAGNOSIS — R140 Abdominal distension (gaseous): Secondary | ICD-10-CM | POA: Diagnosis not present

## 2024-03-03 DIAGNOSIS — K2950 Unspecified chronic gastritis without bleeding: Secondary | ICD-10-CM

## 2024-03-03 DIAGNOSIS — R112 Nausea with vomiting, unspecified: Secondary | ICD-10-CM

## 2024-03-03 MED ORDER — SODIUM CHLORIDE 0.9 % IV SOLN: 500.0000 mL | Freq: Once | INTRAVENOUS | Status: DC

## 2024-03-03 NOTE — Op Note (Signed)
 [facility] Patient Name: Tanya Medina Procedure Date: 03/03/2024 10:09 AM MRN: [id number] Endoscopist: Victory L. Legrand , MD, [id number] Age: 23 Referring MD:  Date of Birth: 2001/06/30 Gender: Female Account #: [id number] Procedure:                Upper GI endoscopy Indications:              Epigastric abdominal pain, Abdominal bloating,                            Diarrhea, Nausea, family history of celiac disease                           negative celiac Ab, symptoms generally resolve with                            avoidance of wheat products                           clinical details in 01/25/24 office note Medicines:                Monitored Anesthesia Care Procedure:                Pre-Anesthesia Assessment:                           - Prior to the procedure, a History and Physical                            was performed, and patient medications and                            allergies were reviewed. The patient's tolerance of                            previous anesthesia was also reviewed. The risks                            and benefits of the procedure and the sedation                            options and risks were discussed with the patient.                            All questions were answered, and informed consent                            was obtained. Prior Anticoagulants: The patient has                            taken no anticoagulant or antiplatelet agents. ASA                            Grade Assessment: I - A normal, healthy patient.  After reviewing the risks and benefits, the patient                            was deemed in satisfactory condition to undergo the                            procedure.                           After obtaining informed consent, the endoscope was                            passed under direct vision. Throughout the                            procedure, the patient's blood pressure,  pulse, and                            oxygen saturations were monitored continuously. The                            Olympus Scope SN [id number] was introduced through the                            mouth, and advanced to the second part of duodenum.                            The upper GI endoscopy was accomplished without                            difficulty. The patient tolerated the procedure                            well. Scope In: Scope Out: Findings:                 The esophagus was normal.                           Normal mucosa was found in the entire examined                            stomach. Biopsies were taken with a cold forceps                            for histology (antrum and body in one path jar).                           The cardia and gastric fundus were normal on                            retroflexion.                           Normal mucosa was found in the first portion of the  duodenum and in the second portion of the duodenum.                            Biopsies for histology were taken with a cold                            forceps for evaluation of celiac disease.                           Mildly nodular mucosa was found in the duodenal                            bulb. Biopsies for histology were taken with a cold                            forceps for evaluation of celiac disease. Complications:            No immediate complications. Estimated Blood Loss:     Estimated blood loss was minimal. Impression:               - Normal esophagus.                           - Normal mucosa was found in the entire stomach.                            Biopsied.                           - Normal mucosa was found in the first portion of                            the duodenum and in the second portion of the                            duodenum. Biopsied. 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 Biopsied. Recommendation:           - Patient has a contact number available for                            emergencies. The signs and symptoms of potential                            delayed complications were discussed with the                            patient. Return to normal activities tomorrow.  Written discharge instructions were provided to the                            patient.                           - Resume previous diet.                           - Continue present medications.                           - Await pathology results. 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 Denishia Citro L. Legrand, MD 03/03/2024 10:38:53 AM This report has been signed electronically.

## 2024-03-03 NOTE — Progress Notes (Signed)
 Called to room to assist during endoscopic procedure.  Patient ID and intended procedure confirmed with present staff. Received instructions for my participation in the procedure from the performing physician.

## 2024-03-03 NOTE — Progress Notes (Signed)
 Pt's states no medical or surgical changes since previsit or office visit.

## 2024-03-03 NOTE — Patient Instructions (Signed)
 Resume previous diet.  Continue present medications.  Awaiting pathology results. 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   YOU HAD AN ENDOSCOPIC PROCEDURE TODAY AT THE [facility]:   Refer to the procedure report that was given to you for any specific questions about what was found during the examination.  If the procedure report does not answer your questions, please call your gastroenterologist to clarify.  If you requested that your care partner not be given the details of your procedure findings, then the procedure report has been included in a sealed envelope for you to review at your convenience later.  YOU SHOULD EXPECT: Some feelings of bloating in the abdomen. Passage of more gas than usual.  Walking can help get rid of the air that was put into your GI tract during the procedure and reduce the bloating. If you had a lower endoscopy (such as a colonoscopy or flexible sigmoidoscopy) you may notice spotting of blood in your stool or on the toilet paper. If you underwent a bowel prep for your procedure, you may not have a normal bowel movement for a few days.  Please Note:  You might notice some irritation and congestion in your nose or some drainage.  This is from the oxygen used during your procedure.  There is no need for concern and it should clear up in a day or so.  SYMPTOMS TO REPORT IMMEDIATELY:  Following upper endoscopy (EGD)  Vomiting of blood or coffee ground material  New chest pain or pain under the shoulder blades  Painful or persistently difficult swallowing  New shortness of breath  Fever of 100F or higher  Black, tarry-looking stools  For urgent or emergent issues, a gastroenterologist can be reached at any hour by calling [phone]. Do not use MyChart messaging for urgent concerns.    DIET:  We do recommend a small meal at first, but then you may proceed to your regular diet.  Drink plenty of  fluids but you should avoid alcoholic beverages for 24 hours.  ACTIVITY:  You should plan to take it easy for the rest of today and you should NOT DRIVE or use heavy machinery until tomorrow (because of the sedation medicines used during the test).    FOLLOW UP: Our staff will call the number listed on your records the next business day following your procedure.  We will call around 7:15- 8:00 am to check on you and address any questions or concerns that you may have regarding the information given to you following your procedure. If we do not reach you, we will leave a message.     If any biopsies were taken you will be contacted by phone or by letter within the next 1-3 weeks.  Please call us  at [phone] if you have not heard about the biopsies in 3 weeks.    SIGNATURES/CONFIDENTIALITY: You and/or your care partner have signed paperwork which will be entered into your electronic medical record.  These signatures attest to the fact that that the information above on your After Visit Summary has been reviewed and is understood.  Full responsibility of the confidentiality of this discharge information lies with you and/or your care-partner.

## 2024-03-03 NOTE — Progress Notes (Signed)
 History and Physical:  This patient presents for endoscopic testing for: Encounter Diagnoses  Name Primary?   Abdominal bloating Yes   Nausea and vomiting, unspecified vomiting type    Abdominal pain, epigastric    Family history of celiac disease     23 yo woman here for endoscopic evaluation of multiple GI symptoms outlined in office consult note dated 01/25/24. Celiac Antibodies negative, + family Hx celiac  Patient is otherwise without complaints or active issues today.   Past Medical History: Past Medical History:  Diagnosis Date   Anemia affecting pregnancy    Hypoglycemia    Idiopathic scoliosis of lumbar region 2010   mild      Past Surgical History: Past Surgical History:  Procedure Laterality Date   NO PAST SURGERIES      Allergies: No Known Allergies  Outpatient Meds: Current Outpatient Medications  Medication Sig Dispense Refill   Cholecalciferol (VITAMIN D3) 25 MCG (1000 UT) CAPS Take 1 capsule (1,000 Units total) by mouth daily.     Current Facility-Administered Medications  Medication Dose Route Frequency Provider Last Rate Last Admin   0.9 %  sodium chloride  infusion  500 mL Intravenous Once Danis, Victory CROME III, MD          ___________________________________________________________________ Objective   Exam:  BP 110/81   Pulse 82   Temp 98 F (36.7 C) (Skin)   Ht 5' 1 (1.549 m)   Wt 102 lb (46.3 kg)   SpO2 100%   BMI 19.27 kg/m   CV: regular , S1/S2 Resp: clear to auscultation bilaterally, normal RR and effort noted GI: soft, no tenderness, with active bowel sounds.   Assessment: Encounter Diagnoses  Name Primary?   Abdominal bloating Yes   Nausea and vomiting, unspecified vomiting type    Abdominal pain, epigastric    Family history of celiac disease      Plan: EGD with biopsies of the duodenum  The benefits and risks of the planned procedure(s) were described in detail with the patient or (when appropriate) their health  care proxy.  Risks were outlined as including, but not limited to, bleeding, infection, perforation, adverse medication reaction leading to cardiac or pulmonary decompensation, pancreatitis (if ERCP).  The limitation of incomplete mucosal visualization was also discussed.  No guarantees or warranties were given.  The patient is appropriate for an endoscopic procedure in the ambulatory setting.   - Victory Brand, MD

## 2024-03-03 NOTE — Progress Notes (Signed)
 Sedate, gd SR, tolerated procedure well, VSS, report to RN

## 2024-03-06 ENCOUNTER — Telehealth: Payer: Self-pay | Admitting: *Deleted

## 2024-03-06 NOTE — Telephone Encounter (Signed)
  Follow up Call-     03/03/2024    9:16 AM  Call back number  Post procedure Call Back phone  # [phone]  Permission to leave phone message Yes     Patient questions:  Do you have a fever, pain , or abdominal swelling? No. Pain Score  0 *  Have you tolerated food without any problems? Yes.    Have you been able to return to your normal activities? Yes.    Do you have any questions about your discharge instructions: Diet   No. Medications  No. Follow up visit  No.  Do you have questions or concerns about your Care? No.  Actions: * If pain score is 4 or above: No action needed, pain <4.

## 2024-03-08 LAB — SURGICAL PATHOLOGY

## 2024-03-09 ENCOUNTER — Ambulatory Visit: Payer: Self-pay | Admitting: Gastroenterology

## 2024-07-13 ENCOUNTER — Encounter: Payer: Self-pay | Admitting: Family Medicine

## 2024-07-13 ENCOUNTER — Ambulatory Visit: Payer: Self-pay

## 2024-07-13 ENCOUNTER — Ambulatory Visit: Admitting: Family Medicine

## 2024-07-13 VITALS — BP 90/60 | HR 85 | Temp 97.9°F | Ht 61.0 in | Wt 104.1 lb

## 2024-07-13 DIAGNOSIS — K625 Hemorrhage of anus and rectum: Secondary | ICD-10-CM | POA: Diagnosis not present

## 2024-07-13 MED ORDER — HYDROCORTISONE ACETATE 25 MG RE SUPP: 25.0000 mg | Freq: Two times a day (BID) | RECTAL | 0 refills | Status: AC

## 2024-07-13 NOTE — Patient Instructions (Signed)
 Go to the lab on the way out.   If you have mychart we'll likely use that to update you.    Take care.  Glad to see you. You could use hydrocortisone suppositories if needed.  Let us  know if you have recurrent symptoms.  We can refer to GI if needed.

## 2024-07-13 NOTE — Telephone Encounter (Signed)
Noted. Will see at OV.  Thanks.  

## 2024-07-13 NOTE — Progress Notes (Signed)
 H/o hemorrhoid since giving birth.  Recently had more blood in BM than typical for patient.  Pt states she had a BM and saw bright red blood yesterday. She had 2 BM's today with the first BM with minimal blood. No black or tarry stools.  She had discomfort with BM.  She had used TUCKs pads.  She can occ feel the hemorrhoid externally.  No abd pain.    She can get mildly lightheaded on standing, if standing too quickly.  That is longstanding.  H/o lower BP at baseline.    Nad Ncat Neck supple, no LA Rrr Ctab Abd soft not ttp Chaperoned exam for external rectal exam.  No acute external lesion.  No gross blood.

## 2024-07-13 NOTE — Telephone Encounter (Signed)
 FYI Only or Action Required?: FYI only for provider: appointment scheduled on 07/13/24.  Patient was last seen in primary care on 10/06/2023 by Rilla Baller, MD.  Called Nurse Triage reporting Rectal Bleeding.  Symptoms began yesterday.  Interventions attempted: Nothing.  Symptoms are: unchanged.  Triage Disposition: See PCP Within 2 Weeks  Patient/caregiver understands and will follow disposition?: Yes   Copied from CRM #[id number]. Topic: Clinical - Red Word Triage >> Jul 13, 2024 12:12 PM Burnard DEL wrote: Red Word that prompted transfer to Nurse Triage: bleeding hemorrhoid Reason for Disposition  [1] Rectal bleeding is minimal (e.g., blood just on toilet paper, few drops, streaks on surface of normal formed BM) AND [2] bleeding recurs 3 or more times after using Care Advice  Answer Assessment - Initial Assessment Questions Pt reports since yesterday, she has noticed bright red blood after BM. Reports she is not currently doing any interventions but is worried as she has not experienced multiple BM with blood in the past; hx of hemorrhoids. Pt okay to see alternative provider d/t appt availability. Appointment scheduled for evaluation. Patient agrees with plan of care, and will call back if anything changes, or if symptoms worsen.     1. APPEARANCE of BLOOD: What color is it? Is it passed separately, on the surface of the stool, or mixed in with the stool?     Bright red x2; pt reports having 2-3 BM daily and with BM yesterday and today noticed some burning during then bright red blood when wiping       2. AMOUNT: How much blood was passed?      Pt reports minimal; a couple drops yesterday, today more on the toilet paper   3. FREQUENCY: How many times has blood been passed with the stools?      Twice  4. ONSET: When was the blood first seen in the stools? (Days or weeks)      Yesterday; pt reports she has had hemorrhoids in the past   5. DIARRHEA: Is there also some  diarrhea? If Yes, ask: How many diarrhea stools in the past 24 hours?      no  6. CONSTIPATION: Do you have constipation? If Yes, ask: How bad is it?     No  7. RECURRENT SYMPTOMS: Have you had blood in your stools before? If Yes, ask: When was the last time? and What happened that time?      Yes; hx of hemorrhoids   8. BLOOD THINNERS: Do you take any blood thinners? (e.g., aspirin, clopidogrel / Plavix, coumadin, heparin). Notes: Other strong blood thinners include: Arixtra (fondaparinux), Eliquis (apixaban), Pradaxa (dabigatran), and Xarelto (rivaroxaban).     no  9. OTHER SYMPTOMS: Do you have any other symptoms?  (e.g., abdomen pain, vomiting, dizziness, fever)     None   10. PREGNANCY: Is there any chance you are pregnant? When was your last menstrual period?       No  Protocols used: Rectal Bleeding-A-AH

## 2024-07-14 DIAGNOSIS — K9041 Non-celiac gluten sensitivity: Secondary | ICD-10-CM | POA: Insufficient documentation

## 2024-07-14 DIAGNOSIS — Z8379 Family history of other diseases of the digestive system: Secondary | ICD-10-CM | POA: Insufficient documentation

## 2024-07-14 LAB — CBC WITH DIFFERENTIAL/PLATELET
Basophils Absolute: 0 K/uL (ref 0.0–0.1)
Basophils Relative: 0.5 % (ref 0.0–3.0)
Eosinophils Absolute: 0 K/uL (ref 0.0–0.7)
Eosinophils Relative: 0.2 % (ref 0.0–5.0)
HCT: 39.2 % (ref 36.0–46.0)
Hemoglobin: 13.4 g/dL (ref 12.0–15.0)
Lymphocytes Relative: 20.1 % (ref 12.0–46.0)
Lymphs Abs: 1.6 K/uL (ref 0.7–4.0)
MCHC: 34.3 g/dL (ref 30.0–36.0)
MCV: 93.6 fl (ref 78.0–100.0)
Monocytes Absolute: 0.6 K/uL (ref 0.1–1.0)
Monocytes Relative: 8.1 % (ref 3.0–12.0)
Neutro Abs: 5.7 K/uL (ref 1.4–7.7)
Neutrophils Relative %: 71.1 % (ref 43.0–77.0)
Platelets: 376 K/uL (ref 150.0–400.0)
RBC: 4.19 Mil/uL (ref 3.87–5.11)
RDW: 12.4 % (ref 11.5–15.5)
WBC: 8 K/uL (ref 4.0–10.5)

## 2024-07-14 NOTE — Telephone Encounter (Signed)
 Appreciate Dr Cleatus seeing this pleasant patient.

## 2024-07-16 ENCOUNTER — Ambulatory Visit: Payer: Self-pay | Admitting: Family Medicine

## 2024-07-16 DIAGNOSIS — K625 Hemorrhage of anus and rectum: Secondary | ICD-10-CM | POA: Insufficient documentation

## 2024-07-16 NOTE — Assessment & Plan Note (Signed)
 See notes on labs .  Discussed int/ext hemorrhoid vs fissure.  At this point okay for outpatient f/u.  Presumed internal hemorrhoid.  She could use hydrocortisone  suppositories if needed.  She can let us  know if recurrent symptoms.  We can refer to GI if needed.

## 2024-10-09 ENCOUNTER — Encounter: Admitting: Family Medicine
